# Patient Record
Sex: Female | Born: 1960 | Race: Black or African American | Hispanic: No | Smoking: Current every day smoker
Health system: Southern US, Community
[De-identification: ages and names within clinical notes are randomized; demographics above are authoritative.]

## PROBLEM LIST (undated history)

## (undated) DIAGNOSIS — M1990 Unspecified osteoarthritis, unspecified site: Secondary | ICD-10-CM

## (undated) DIAGNOSIS — K5909 Other constipation: Secondary | ICD-10-CM

## (undated) DIAGNOSIS — I10 Essential (primary) hypertension: Secondary | ICD-10-CM

## (undated) DIAGNOSIS — A5901 Trichomonal vulvovaginitis: Secondary | ICD-10-CM

## (undated) DIAGNOSIS — N898 Other specified noninflammatory disorders of vagina: Secondary | ICD-10-CM

## (undated) DIAGNOSIS — W540XXA Bitten by dog, initial encounter: Secondary | ICD-10-CM

## (undated) DIAGNOSIS — R519 Headache, unspecified: Secondary | ICD-10-CM

## (undated) HISTORY — DX: Unspecified osteoarthritis, unspecified site: M19.90

## (undated) HISTORY — DX: Bitten by dog, initial encounter: W54.0XXA

## (undated) HISTORY — DX: Other specified noninflammatory disorders of vagina: N89.8

---

## 2008-07-07 ENCOUNTER — Emergency Department: Payer: Self-pay | Admitting: Emergency Medicine

## 2008-09-01 ENCOUNTER — Emergency Department: Payer: Self-pay | Admitting: Emergency Medicine

## 2009-02-12 ENCOUNTER — Emergency Department: Payer: Self-pay | Admitting: Emergency Medicine

## 2009-02-13 ENCOUNTER — Emergency Department: Payer: Self-pay | Admitting: Emergency Medicine

## 2009-05-14 ENCOUNTER — Emergency Department: Payer: Self-pay | Admitting: Unknown Physician Specialty

## 2009-05-16 ENCOUNTER — Emergency Department: Payer: Self-pay | Admitting: Emergency Medicine

## 2010-07-16 ENCOUNTER — Emergency Department: Payer: Self-pay | Admitting: Emergency Medicine

## 2010-10-24 ENCOUNTER — Emergency Department: Payer: Self-pay | Admitting: Emergency Medicine

## 2010-11-07 ENCOUNTER — Emergency Department: Payer: Self-pay | Admitting: Emergency Medicine

## 2010-11-28 ENCOUNTER — Ambulatory Visit: Payer: Self-pay | Admitting: Specialist

## 2011-04-26 ENCOUNTER — Emergency Department: Payer: Self-pay | Admitting: Emergency Medicine

## 2011-10-15 ENCOUNTER — Emergency Department: Payer: Self-pay | Admitting: Emergency Medicine

## 2011-10-28 ENCOUNTER — Ambulatory Visit: Payer: Self-pay | Admitting: Family Medicine

## 2013-07-07 ENCOUNTER — Emergency Department: Payer: Self-pay | Admitting: Emergency Medicine

## 2013-12-24 ENCOUNTER — Observation Stay: Payer: Self-pay | Admitting: Internal Medicine

## 2013-12-24 LAB — CK TOTAL AND CKMB (NOT AT ARMC)
CK, Total: 42 U/L
CK-MB: <0.5 ng/mL — ABNORMAL LOW (ref 0.5–3.6)

## 2013-12-24 LAB — CBC
HCT: 39.4 % (ref 35.0–47.0)
HGB: 13.2 g/dL (ref 12.0–16.0)
MCH: 30.4 pg (ref 26.0–34.0)
MCHC: 33.5 g/dL (ref 32.0–36.0)
MCV: 91 fL (ref 80–100)
Platelet: 279 10*3/uL (ref 150–440)
RBC: 4.34 10*6/uL (ref 3.80–5.20)
RDW: 15.9 % — AB (ref 11.5–14.5)
WBC: 4.7 10*3/uL (ref 3.6–11.0)

## 2013-12-24 LAB — BASIC METABOLIC PANEL
ANION GAP: 4 — AB (ref 7–16)
BUN: 10 mg/dL (ref 7–18)
CALCIUM: 8.5 mg/dL (ref 8.5–10.1)
CO2: 25 mmol/L (ref 21–32)
CREATININE: 1.09 mg/dL (ref 0.60–1.30)
Chloride: 113 mmol/L — ABNORMAL HIGH (ref 98–107)
EGFR (African American): >60
EGFR (Non-African Amer.): 59 — ABNORMAL LOW
GLUCOSE: 81 mg/dL (ref 65–99)
Osmolality: 281 (ref 275–301)
POTASSIUM: 4 mmol/L (ref 3.5–5.1)
Sodium: 142 mmol/L (ref 136–145)

## 2013-12-24 LAB — TROPONIN I
TROPONIN-I: 0.24 ng/mL — AB
Troponin-I: 0.22 ng/mL — ABNORMAL HIGH

## 2013-12-24 LAB — CK-MB: CK-MB: <0.5 ng/mL — ABNORMAL LOW (ref 0.5–3.6)

## 2013-12-25 LAB — CK TOTAL AND CKMB (NOT AT ARMC)
CK, Total: 39 U/L
CK-MB: <0.5 ng/mL — ABNORMAL LOW (ref 0.5–3.6)

## 2013-12-25 LAB — TROPONIN I: Troponin-I: 0.2 ng/mL — ABNORMAL HIGH

## 2014-01-03 ENCOUNTER — Emergency Department: Payer: Self-pay | Admitting: Emergency Medicine

## 2014-01-03 LAB — BASIC METABOLIC PANEL
Anion Gap: 3 — ABNORMAL LOW (ref 7–16)
BUN: 9 mg/dL (ref 7–18)
CHLORIDE: 110 mmol/L — AB (ref 98–107)
CO2: 25 mmol/L (ref 21–32)
Calcium, Total: 8.4 mg/dL — ABNORMAL LOW (ref 8.5–10.1)
Creatinine: 0.98 mg/dL (ref 0.60–1.30)
EGFR (African American): >60
EGFR (Non-African Amer.): >60
GLUCOSE: 90 mg/dL (ref 65–99)
OSMOLALITY: 274 (ref 275–301)
POTASSIUM: 3.5 mmol/L (ref 3.5–5.1)
Sodium: 138 mmol/L (ref 136–145)

## 2014-01-03 LAB — CBC
HCT: 35.4 % (ref 35.0–47.0)
HGB: 12 g/dL (ref 12.0–16.0)
MCH: 30.6 pg (ref 26.0–34.0)
MCHC: 33.9 g/dL (ref 32.0–36.0)
MCV: 90 fL (ref 80–100)
Platelet: 205 10*3/uL (ref 150–440)
RBC: 3.94 10*6/uL (ref 3.80–5.20)
RDW: 15.4 % — AB (ref 11.5–14.5)
WBC: 4.8 10*3/uL (ref 3.6–11.0)

## 2014-01-03 LAB — TROPONIN I: Troponin-I: 0.19 ng/mL — ABNORMAL HIGH

## 2014-01-16 ENCOUNTER — Emergency Department: Payer: Self-pay | Admitting: Emergency Medicine

## 2014-03-24 ENCOUNTER — Emergency Department: Payer: Self-pay | Admitting: Emergency Medicine

## 2014-04-27 ENCOUNTER — Observation Stay: Payer: Self-pay | Admitting: Internal Medicine

## 2014-04-27 LAB — CBC
HCT: 38 % (ref 35.0–47.0)
HGB: 12.4 g/dL (ref 12.0–16.0)
MCH: 29.6 pg (ref 26.0–34.0)
MCHC: 32.5 g/dL (ref 32.0–36.0)
MCV: 91 fL (ref 80–100)
Platelet: 248 10*3/uL (ref 150–440)
RBC: 4.17 10*6/uL (ref 3.80–5.20)
RDW: 15 % — ABNORMAL HIGH (ref 11.5–14.5)
WBC: 6.1 10*3/uL (ref 3.6–11.0)

## 2014-04-27 LAB — BASIC METABOLIC PANEL
Anion Gap: 8 (ref 7–16)
BUN: 13 mg/dL (ref 7–18)
Calcium, Total: 8.7 mg/dL (ref 8.5–10.1)
Chloride: 108 mmol/L — ABNORMAL HIGH (ref 98–107)
Co2: 24 mmol/L (ref 21–32)
Creatinine: 1.16 mg/dL (ref 0.60–1.30)
EGFR (Non-African Amer.): 55 — ABNORMAL LOW
GLUCOSE: 110 mg/dL — AB (ref 65–99)
OSMOLALITY: 280 (ref 275–301)
Potassium: 3.4 mmol/L — ABNORMAL LOW (ref 3.5–5.1)
SODIUM: 140 mmol/L (ref 136–145)

## 2014-04-27 LAB — CK-MB
CK-MB: <0.5 ng/mL — ABNORMAL LOW (ref 0.5–3.6)
CK-MB: 0.7 ng/mL (ref 0.5–3.6)

## 2014-04-27 LAB — TROPONIN I
Troponin-I: 0.72 ng/mL — ABNORMAL HIGH
Troponin-I: 0.7 ng/mL — ABNORMAL HIGH

## 2014-04-28 LAB — CBC WITH DIFFERENTIAL/PLATELET
BASOS PCT: 0.9 %
Basophil #: 0 10*3/uL (ref 0.0–0.1)
EOS ABS: 0.1 10*3/uL (ref 0.0–0.7)
EOS PCT: 1 %
HCT: 35.1 % (ref 35.0–47.0)
HGB: 11.8 g/dL — ABNORMAL LOW (ref 12.0–16.0)
Lymphocyte #: 2.3 10*3/uL (ref 1.0–3.6)
Lymphocyte %: 45.4 %
MCH: 30.2 pg (ref 26.0–34.0)
MCHC: 33.5 g/dL (ref 32.0–36.0)
MCV: 90 fL (ref 80–100)
MONO ABS: 0.3 x10 3/mm (ref 0.2–0.9)
MONOS PCT: 5.1 %
NEUTROS PCT: 47.6 %
Neutrophil #: 2.4 10*3/uL (ref 1.4–6.5)
Platelet: 220 10*3/uL (ref 150–440)
RBC: 3.89 10*6/uL (ref 3.80–5.20)
RDW: 15 % — AB (ref 11.5–14.5)
WBC: 5 10*3/uL (ref 3.6–11.0)

## 2014-04-28 LAB — DRUG SCREEN, URINE
Amphetamines, Ur Screen: NEGATIVE (ref ?–1000)
BENZODIAZEPINE, UR SCRN: NEGATIVE (ref ?–200)
Barbiturates, Ur Screen: NEGATIVE (ref ?–200)
Cannabinoid 50 Ng, Ur ~~LOC~~: NEGATIVE (ref ?–50)
Cocaine Metabolite,Ur ~~LOC~~: POSITIVE (ref ?–300)
MDMA (ECSTASY) UR SCREEN: NEGATIVE (ref ?–500)
Methadone, Ur Screen: NEGATIVE (ref ?–300)
OPIATE, UR SCREEN: NEGATIVE (ref ?–300)
Phencyclidine (PCP) Ur S: NEGATIVE (ref ?–25)
TRICYCLIC, UR SCREEN: NEGATIVE (ref ?–1000)

## 2014-04-28 LAB — BASIC METABOLIC PANEL
Anion Gap: 7 (ref 7–16)
BUN: 14 mg/dL (ref 7–18)
CREATININE: 1.07 mg/dL (ref 0.60–1.30)
Calcium, Total: 8.3 mg/dL — ABNORMAL LOW (ref 8.5–10.1)
Chloride: 108 mmol/L — ABNORMAL HIGH (ref 98–107)
Co2: 26 mmol/L (ref 21–32)
EGFR (African American): >60
EGFR (Non-African Amer.): >60
GLUCOSE: 110 mg/dL — AB (ref 65–99)
OSMOLALITY: 282 (ref 275–301)
Potassium: 3.5 mmol/L (ref 3.5–5.1)
SODIUM: 141 mmol/L (ref 136–145)

## 2014-04-28 LAB — CK-MB: CK-MB: 0.8 ng/mL (ref 0.5–3.6)

## 2014-04-28 LAB — TROPONIN I: Troponin-I: 0.66 ng/mL — ABNORMAL HIGH

## 2014-10-23 ENCOUNTER — Ambulatory Visit: Payer: Self-pay | Admitting: Internal Medicine

## 2014-12-19 DIAGNOSIS — M542 Cervicalgia: Secondary | ICD-10-CM | POA: Insufficient documentation

## 2014-12-19 DIAGNOSIS — R200 Anesthesia of skin: Secondary | ICD-10-CM | POA: Insufficient documentation

## 2015-02-07 DIAGNOSIS — R200 Anesthesia of skin: Secondary | ICD-10-CM | POA: Insufficient documentation

## 2015-02-07 DIAGNOSIS — R202 Paresthesia of skin: Secondary | ICD-10-CM

## 2015-02-07 DIAGNOSIS — R29898 Other symptoms and signs involving the musculoskeletal system: Secondary | ICD-10-CM | POA: Insufficient documentation

## 2015-02-09 NOTE — Discharge Summary (Signed)
PATIENT NAME:  Amy Waller, DELOREY MR#:  [id number] DATE OF BIRTH:  1961-04-14  DATE OF ADMISSION:  04/27/2014 DATE OF DISCHARGE:  04/28/2014  PRIMARY CARE PHYSICIAN: Dr. Brynda Greathouse.  CARDIOLOGIST: Dr. Clayborn Bigness.   FINAL DIAGNOSES:  1. Cocaine-induced coronary vasospasm with elevated troponin.  2. Hypertension.  3. Anxiety and depression.   MEDICATIONS ON DISCHARGE: Include diazepam 2 mg 4 times a day as needed as she takes at home, metoprolol tartrate 25 mg 1 tablet twice a day as she takes at home, aspirin 81 mg daily. Stop taking meloxicam. The patient states that she is not taking lisinopril.   DIET: Low-sodium diet, regular consistency.  FOLLOWUP: Follow up with Dr. Clayborn Bigness, cardiology, next week, in 1-2 weeks with Dr. Brynda Greathouse.   HOSPITAL COURSE: The patient was admitted 04/27/2014, discharged 04/28/2014. Came in with chest pain. She was admitted with chest pain and elevated troponin symptoms concerning for acute coronary syndrome. The patient was started on aspirin, metoprolol, nitroglycerin, and a cardiology evaluation was ordered.   LABORATORY AND RADIOLOGICAL DATA DURING THE HOSPITAL COURSE: Included a glucose of 110, BUN 13, creatinine 1.16, sodium 140, potassium 3.4, chloride 108, CO2 of 24, calcium 8.7. White blood cell count 6.1, H and H 12.4 and 38.0, platelet count of 248,000. First troponin borderline at 0.72.   Chest x-ray: No acute cardiopulmonary disease.   The next troponin borderline at 0.7, next troponin borderline at 0.66. Urine toxicology positive for cocaine. Repeat potassium 3.5.   HOSPITAL COURSE PER PROBLEM LIST:  1. Cocaine-induced coronary vasospasm with elevated troponin. The patient was seen in consultation by Dr. Clayborn Bigness. Currently no note in the computer, but I did speak with him. He said okay to discharge home and follow up next week in the office for consideration of stress test. I did continue the patient's aspirin that was started here in the hospital. I  decreased the dose of the metoprolol that she takes at home to 25 mg twice a day. I told her if she is going to use cocaine, that she is not to use the metoprolol. The patient states that she does not use cocaine. She thinks the blunt that she smoked may have been laced. The patient is currently chest pain free at this time and stable for discharge home.  2. Hypertension. Blood pressure stable during the hospital course. Blood pressure upon discharge 106/68.  3. Anxiety and depression on Valium that she takes at home. 4. Tobacco abuse. The patient was counseled on smoking by Dr. Vianne Bulls, the admitting physician.   TIME SPENT ON DISCHARGE: 35 minutes.    ____________________________ Tana Conch. Leslye Peer, MD rjw:lt D: 04/28/2014 14:15:48 ET T: 04/28/2014 21:47:24 ET JOB#: [id number]  cc: Tana Conch. Leslye Peer, MD, <Dictator> Mikeal Hawthorne. Brynda Greathouse, MD Dwayne D. Clayborn Bigness, MD   Marisue Brooklyn MD ELECTRONICALLY SIGNED 05/02/2014 15:18

## 2015-02-09 NOTE — Discharge Summary (Signed)
PATIENT NAME:  Amy Waller, CHAVERO MR#:  [id number] DATE OF BIRTH:  [birth year]  DATE OF ADMISSION:  12/24/2013 DATE OF DISCHARGE:  12/25/2013  ADMISSION DIAGNOSIS: Hypertensive urgency.   DISCHARGE DIAGNOSES: 1.  Hypertensive urgency.  2.  Elevated troponin.  3.  Tobacco use disorder.   CONSULTATIONS: None.   PERTINENT LABORATORIES AT DISCHARGE: Troponin max 0.24, troponin at discharge 0.20.  HOSPITAL COURSE: This is a 54 year old female with hypertension, who had previously been on the medications, who was admitted for hypertensive urgency. For further details please refer to the H and P.  1.  Hypertensive urgency. The patient was started on 2 medications, metoprolol as well as hydralazine. Her blood pressure is better; however, due to the patient being noncompliant for so long, I fear that the patient will be noncompliant to medications so I discharged her with metoprolol 50 mg p.o. b.i.d. and she will follow up with Dr. Brynda Greathouse as an outpatient. For  adequate blood pressure control, I also advised her to check her blood pressure daily with a blood pressure kit that she can obtain from a pharmacy.  2.  Elevated troponin secondary to demand ischemia from hypertensive urgency.  3.  Tobacco dependence. The patient was counseled by the admitting physician regarding stopping smoking.   DISCHARGE MEDICATIONS: 1.  Cyclobenzaprine 10 mg t.i.d. p.r.n.  2.  Meloxicam 15 mg b.i.d. p.r.n.  3.  Aspirin 325 mg daily as needed for headache.   4.  Metoprolol 25 mg 2 tablets b.i.d.  5.  Nicotine patch 14 mg per 24 hours.   DISCHARGE DIET: Low sodium.   DISCHARGE ACTIVITY: As tolerated.   DISCHARGE FOLLOWUP:  The patient will follow up next week with Dr. Brynda Greathouse.   TIME SPENT:  35 minutes.  The patient is medically stable for discharge.   ____________________________ Juanito Gonyer P. Benjie Karvonen, MD spm:cs D: 12/25/2013 13:58:37 ET T: 12/25/2013 14:29:32 ET JOB#: [id number]  cc: Sheyanne Munley P. Benjie Karvonen, MD,  <Dictator> Mikeal Hawthorne. Brynda Greathouse, MD Donell Beers Keilana Morlock MD ELECTRONICALLY SIGNED 12/26/2013 8:38

## 2015-02-09 NOTE — Consult Note (Signed)
PATIENT NAME:  Amy Waller, Amy Waller MR#:  [id number] DATE OF BIRTH:  1961/10/13  DATE OF CONSULTATION:  04/28/2014  REFERRING PHYSICIAN:  Epifanio Lesches, MD CONSULTING PHYSICIAN:  Karletta Millay D. Cameron Katayama, MD  INDICATION FOR CONSULTATION: Chest pain.  HISTORY OF PRESENT ILLNESS: The patient is a 54 year old black female with history of hypertension who has been admitted back in March with chest pain and uncontrolled hypertension and came back today because chest pain recurred. The patient states she has been having chest pain on and off, midsternal, since the day before, which comes and goes, and each time it lasts about an hour. The patient also states chest pain radiating to her left arm was associated with arm numbness. The patient feels short of breath and has dizziness with the chest pain. Troponins were slightly up, 2.72, during the hospitalization. The patient was admitted last time in March and did not have any stress test or echocardiogram, and the patient's chest pain was felt to be secondary to malignant hypertension. She was to be started on blood pressure medication. The patient says that since then she has been compliant with her medication and takes them regularly, but still has recurrent chest pain.   PAST MEDICAL HISTORY: Hypertension.   ALLERGIES: No known drug allergies.   SOCIAL HISTORY: Smokes 3 cigars a day. No alcohol consumption. She denies drugs. Lives with her daughter.   PAST SURGICAL HISTORY: None.   MEDICATIONS: Mobic 15 mg twice a day, aspirin 325 a day, metoprolol 25 mg 2 tablets twice a day, NicoDerm patch, isosorbide.   FAMILY HISTORY: Blood pressure problems.   REVIEW OF SYSTEMS: No blackout spells or syncope.  No nausea or vomiting. No fever and no chills. No sweats. No weight loss or weight gain, hemoptysis or hematemesis. No bright red blood per rectum. No vision change or hearing change. Denies sputum production or cough.   PHYSICAL EXAMINATION: VITAL  SIGNS: Blood pressure 110/60, pulse 75, respiratory rate 16, afebrile.  HEENT: Normocephalic, atraumatic. Pupils equal and reactive to light.  NECK: Supple. No significant JVD, bruits or adenopathy.  LUNGS: Clear to auscultation and percussion. No significant wheeze, rhonchi, or rale.  HEART: Regular rate and rhythm. Systolic ejection murmur at the apex. Positive S4. PMI nondisplaced.  ABDOMEN: Benign.  EXTREMITIES: Within normal limits.  NEUROLOGIC: Intact.  SKIN: Normal   LABORATORIES: Troponin was 0.7. White count 6.1, platelet count 12.4, hemoglobin 38, platelet count 248,000. Electrolytes: Sodium 140, potassium 3.4, chloride 108, bicarb 25, BUN 13, creatinine 1.16, glucose 110. CK/MB less than 0.5.   CHEST X-RAY: No acute changes.   EKG: Normal sinus rhythm, rate of 75. Nonspecific ST-T wave changes.  Tox screen positive for cocaine.   ASSESSMENT: 1.  Chest pain. 2.  Mildly elevated troponins.  3.  Hypertension. 4.  Tobacco abuse. 5.  Degenerative joint disease. 6.  Positive tox screen.   PLAN: 1.  Recommend continued telemetry. Recommend continued blood pressure control. Aspirin. Consider Plavix. Consider echocardiogram. Follow-up EKGs and further troponins. If chest pain continues would recommend either a functional study or cardiac catheterization. 2.  For hypertension, continue hypertension control. Appears to be adequately controlled today. The patient states compliance with her medication. Would consider adding ACE inhibitor in addition to the beta blocker.  3.  For degenerative joint disease, continue Mobic as necessary.  4.  Advised the patient to quit smoking.  5.  Tox screen appears to have cocaine, although it is unclear the etiology or significance, but this could represent  some cardiac symptoms like spasm. Recommend the patient refrain from illicit drug use. 6.  Will have the patient followup. If chest pain continues, would consider cardiac catheterization at that  point. Will have the patient followup for further cardiac evaluation depending on her symptoms.   ____________________________ Loran Senters Clayborn Bigness, MD ddc:sb D: 05/03/2014 13:02:01 ET T: 05/03/2014 13:18:10 ET JOB#: [id number]  cc: Breezie Micucci D. Clayborn Bigness, MD, <Dictator> Yolonda Kida MD ELECTRONICALLY SIGNED 06/01/2014 12:31
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
## 2015-07-09 ENCOUNTER — Emergency Department
Admission: EM | Admit: 2015-07-09 | Discharge: 2015-07-09 | Disposition: A | Discharge status: Home / Self Care | Payer: Medicaid Other | Attending: Emergency Medicine | Admitting: Emergency Medicine

## 2015-07-09 ENCOUNTER — Encounter: Payer: Self-pay | Admitting: *Deleted

## 2015-07-09 DIAGNOSIS — N760 Acute vaginitis: Secondary | ICD-10-CM | POA: Insufficient documentation

## 2015-07-09 DIAGNOSIS — Z720 Tobacco use: Secondary | ICD-10-CM | POA: Diagnosis not present

## 2015-07-09 DIAGNOSIS — R309 Painful micturition, unspecified: Secondary | ICD-10-CM | POA: Diagnosis present

## 2015-07-09 LAB — URINALYSIS COMPLETE WITH MICROSCOPIC (ARMC ONLY)
BACTERIA UA: NONE SEEN
Bilirubin Urine: NEGATIVE
Glucose, UA: NEGATIVE mg/dL
Ketones, ur: NEGATIVE mg/dL
LEUKOCYTES UA: NEGATIVE
Nitrite: NEGATIVE
PH: 7 (ref 5.0–8.0)
Protein, ur: NEGATIVE mg/dL
Specific Gravity, Urine: 1.008 (ref 1.005–1.030)

## 2015-07-09 LAB — CHLAMYDIA/NGC RT PCR (ARMC ONLY)
Chlamydia Tr: NOT DETECTED
N GONORRHOEAE: NOT DETECTED

## 2015-07-09 LAB — WET PREP, GENITAL
Clue Cells Wet Prep HPF POC: NONE SEEN
Trich, Wet Prep: NONE SEEN
WBC WET PREP: NONE SEEN
Yeast Wet Prep HPF POC: NONE SEEN

## 2015-07-09 MED ORDER — FLUCONAZOLE 150 MG PO TABS: 150.0000 mg | ORAL_TABLET | Freq: Once | ORAL | Status: DC

## 2015-07-09 NOTE — ED Notes (Signed)
poct pregnancy Negative 

## 2015-07-09 NOTE — ED Notes (Signed)
Pt reports tampon lost in vagina for 1 week.  Pt states she has a foul odor and burning with urination.

## 2015-07-09 NOTE — Discharge Instructions (Signed)

## 2015-07-09 NOTE — ED Provider Notes (Signed)
[facility] Emergency Department Provider Note  ____________________________________________  Time seen: On arrival  I have reviewed the triage vital signs and the nursing notes.   HISTORY  Chief Complaint Foreign Body in Vagina    HPI Amy Waller is a 54 y.o. female who presents with complaints of burning with urination. She reports generalized vaginal discomfort. She is somewhat concerned that she may forgotten tampon but she is not sure. She also reports she went to be checked for STDs. She denies fevers chills. No rash. No nausea vomiting.    No past medical history on file.  There are no active problems to display for this patient.   No past surgical history on file.  No current outpatient prescriptions on file.  Allergies Review of patient's allergies indicates no known allergies.  No family history on file.  Social History Social History  Substance Use Topics  . Smoking status: Current Every Day Smoker  . Smokeless tobacco: Not on file  . Alcohol Use: No    Review of Systems  Constitutional: Negative for fever. Eyes: Negative for discharge ENT: Negative for sore throat Genitourinary: Positive for dysuria Musculoskeletal: Negative for back pain. Skin: Negative for rash. Neurological: Negative for headaches    ____________________________________________   PHYSICAL EXAM:  VITAL SIGNS: ED Triage Vitals  Enc Vitals Group     BP 07/09/15 1642 129/83 mmHg     Pulse Rate 07/09/15 1642 74     Resp 07/09/15 1642 18     Temp 07/09/15 1642 98.6 F (37 C)     Temp Source 07/09/15 1642 Oral     SpO2 07/09/15 1642 99 %     Weight 07/09/15 1642 117 lb (53.071 kg)     Height 07/09/15 1642 5\' 1"  (1.549 m)     Head Cir --      Peak Flow --      Pain Score 07/09/15 1644 10     Pain Loc --      Pain Edu? --      [place] --      Constitutional: Alert and oriented. Well appearing and in no distress. Eyes:  Conjunctivae are normal.  ENT   Head: Normocephalic and atraumatic.   Mouth/Throat: Mucous membranes are moist. Cardiovascular: Normal rate, regular rhythm.  Respiratory: Normal respiratory effort without tachypnea nor retractions.  Gastrointestinal: Soft and non-tender in all quadrants. No distention. There is no CVA tenderness. GU: No foreign body in vagina. Thick whitish discharge suspicious for yeast infection. No CMT Musculoskeletal: Nontender with normal range of motion in all extremities. Neurologic:  Normal speech and language. No gross focal neurologic deficits are appreciated. Skin:  Skin is warm, dry and intact. No rash noted. Psychiatric: Mood and affect are normal. Patient exhibits appropriate insight and judgment.  ____________________________________________    LABS (pertinent positives/negatives)  Labs Reviewed  URINALYSIS COMPLETEWITH MICROSCOPIC ([facility] ONLY) - Abnormal; Notable for the following:    Color, Urine YELLOW (*)    APPearance HAZY (*)    Hgb urine dipstick 1+ (*)    Squamous Epithelial / LPF 6-30 (*)    All other components within normal limits  CHLAMYDIA/NGC RT PCR ([facility] ONLY)  WET PREP, GENITAL    ____________________________________________     ____________________________________________    RADIOLOGY I have personally reviewed any xrays that were ordered on this patient: None  ____________________________________________   PROCEDURES  Procedure(s) performed: none   ____________________________________________   INITIAL IMPRESSION / ASSESSMENT AND PLAN /  ED COURSE  Pertinent labs & imaging results that were available during my care of the patient were reviewed by me and considered in my medical decision making (see chart for details).  Patient well. No distress. No foreign body on GU exam. Wet prep and GC chlamydia sent. Urinalysis unremarkable. Suspect yeast vaginitis. We will prescribe fluconazole and have the  patient follow-up as needed  ____________________________________________   FINAL CLINICAL IMPRESSION(S) / ED DIAGNOSES  Final diagnoses:  Vaginitis     Lavonia Drafts, MD 07/09/15 [phone]

## 2015-07-23 ENCOUNTER — Other Ambulatory Visit: Payer: Self-pay | Admitting: Neurology

## 2015-07-23 DIAGNOSIS — M79601 Pain in right arm: Secondary | ICD-10-CM

## 2015-07-23 DIAGNOSIS — M5412 Radiculopathy, cervical region: Secondary | ICD-10-CM

## 2015-07-23 DIAGNOSIS — M542 Cervicalgia: Secondary | ICD-10-CM

## 2015-08-03 ENCOUNTER — Ambulatory Visit: Admission: RE | Admit: 2015-08-03 | Payer: Medicaid Other | Source: Ambulatory Visit

## 2015-08-10 ENCOUNTER — Ambulatory Visit
Admission: RE | Admit: 2015-08-10 | Discharge: 2015-08-10 | Disposition: A | Discharge status: Home / Self Care | Payer: Medicaid Other | Source: Ambulatory Visit | Attending: Neurology | Admitting: Neurology

## 2015-08-10 DIAGNOSIS — M50323 Other cervical disc degeneration at C6-C7 level: Secondary | ICD-10-CM | POA: Diagnosis not present

## 2015-08-10 DIAGNOSIS — M79601 Pain in right arm: Secondary | ICD-10-CM

## 2015-08-10 DIAGNOSIS — M47892 Other spondylosis, cervical region: Secondary | ICD-10-CM | POA: Diagnosis not present

## 2015-08-10 DIAGNOSIS — M5412 Radiculopathy, cervical region: Secondary | ICD-10-CM | POA: Diagnosis present

## 2015-08-10 DIAGNOSIS — M4802 Spinal stenosis, cervical region: Secondary | ICD-10-CM | POA: Diagnosis not present

## 2015-08-10 DIAGNOSIS — M542 Cervicalgia: Secondary | ICD-10-CM

## 2015-08-10 DIAGNOSIS — M4803 Spinal stenosis, cervicothoracic region: Secondary | ICD-10-CM | POA: Insufficient documentation

## 2015-08-10 DIAGNOSIS — G9589 Other specified diseases of spinal cord: Secondary | ICD-10-CM | POA: Insufficient documentation

## 2015-08-14 ENCOUNTER — Ambulatory Visit: Payer: Medicaid Other

## 2015-11-18 ENCOUNTER — Emergency Department
Admission: EM | Admit: 2015-11-18 | Discharge: 2015-11-19 | Disposition: A | Discharge status: Home / Self Care | Payer: Medicaid Other | Attending: Emergency Medicine | Admitting: Emergency Medicine

## 2015-11-18 DIAGNOSIS — Y998 Other external cause status: Secondary | ICD-10-CM | POA: Insufficient documentation

## 2015-11-18 DIAGNOSIS — I10 Essential (primary) hypertension: Secondary | ICD-10-CM | POA: Diagnosis not present

## 2015-11-18 DIAGNOSIS — Z23 Encounter for immunization: Secondary | ICD-10-CM | POA: Insufficient documentation

## 2015-11-18 DIAGNOSIS — F17200 Nicotine dependence, unspecified, uncomplicated: Secondary | ICD-10-CM | POA: Insufficient documentation

## 2015-11-18 DIAGNOSIS — S71151A Open bite, right thigh, initial encounter: Secondary | ICD-10-CM | POA: Diagnosis present

## 2015-11-18 DIAGNOSIS — S71101A Unspecified open wound, right thigh, initial encounter: Secondary | ICD-10-CM | POA: Diagnosis not present

## 2015-11-18 DIAGNOSIS — Y9389 Activity, other specified: Secondary | ICD-10-CM | POA: Insufficient documentation

## 2015-11-18 DIAGNOSIS — Y9289 Other specified places as the place of occurrence of the external cause: Secondary | ICD-10-CM | POA: Insufficient documentation

## 2015-11-18 DIAGNOSIS — S71111A Laceration without foreign body, right thigh, initial encounter: Secondary | ICD-10-CM | POA: Insufficient documentation

## 2015-11-18 DIAGNOSIS — W540XXA Bitten by dog, initial encounter: Secondary | ICD-10-CM | POA: Insufficient documentation

## 2015-11-18 HISTORY — DX: Essential (primary) hypertension: I10

## 2015-11-18 MED ORDER — LIDOCAINE HCL (PF) 1 % IJ SOLN
5.0000 mL | Freq: Once | INTRAMUSCULAR | Status: AC
  Administered 2015-11-19: 5 mL
  Filled 2015-11-18: qty 5

## 2015-11-18 MED ORDER — AMOXICILLIN-POT CLAVULANATE 875-125 MG PO TABS: 1.0000 | ORAL_TABLET | Freq: Two times a day (BID) | ORAL | Status: DC

## 2015-11-18 MED ORDER — TETANUS-DIPHTH-ACELL PERTUSSIS 5-2.5-18.5 LF-MCG/0.5 IM SUSP
0.5000 mL | Freq: Once | INTRAMUSCULAR | Status: AC
  Administered 2015-11-18: 0.5 mL via INTRAMUSCULAR
  Filled 2015-11-18: qty 0.5

## 2015-11-18 MED ORDER — TRAMADOL HCL 50 MG PO TABS: 50.0000 mg | ORAL_TABLET | Freq: Four times a day (QID) | ORAL | Status: AC | PRN

## 2015-11-18 NOTE — ED Provider Notes (Signed)
[facility] Emergency Department Provider Note  ____________________________________________  Time seen: Approximately 10:52 PM  I have reviewed the triage vital signs and the nursing notes.   HISTORY  Chief Complaint Animal Bite    HPI Amy Waller is a 55 y.o. female, NAD, presents to the emergency department after being bitten on her right thigh by her neighbor's dog earlier this evening. States she applied a bandage which has helped. No significant pain to the area. The animal's vaccination status is unknown at this time. Animal control is currently talking with the patient again further details. Patient notes she has had a tetanus vaccine in the past but is uncertain of the date.   Past Medical History  Diagnosis Date  . Hypertension     There are no active problems to display for this patient.   History reviewed. No pertinent past surgical history.  Current Outpatient Rx  Name  Route  Sig  Dispense  Refill  . amoxicillin-clavulanate (AUGMENTIN) 875-125 MG tablet   Oral   Take 1 tablet by mouth 2 (two) times daily.   14 tablet   0   . fluconazole (DIFLUCAN) 150 MG tablet   Oral   Take 1 tablet (150 mg total) by mouth once. And then one tablet 72 hours later   2 tablet   0   . traMADol (ULTRAM) 50 MG tablet   Oral   Take 1 tablet (50 mg total) by mouth every 6 (six) hours as needed.   10 tablet   0     Allergies Review of patient's allergies indicates no known allergies.  History reviewed. No pertinent family history.  Social History Social History  Substance Use Topics  . Smoking status: Current Every Day Smoker  . Smokeless tobacco: None  . Alcohol Use: No     Review of Systems  Constitutional: No fever/chills Eyes: No visual changes. No discharge Cardiovascular: No chest pain. Respiratory: No shortness of breath.  Gastrointestinal: No abdominal pain.  No nausea, vomiting.   Musculoskeletal: No myalgias or joint  pain Skin: Positive for 2 lacerations of the right thigh. Negative for rash. Neurological: Negative for headaches, focal weakness or numbness. 10-point ROS otherwise negative.  ____________________________________________   PHYSICAL EXAM:  VITAL SIGNS: ED Triage Vitals  Enc Vitals Group     BP 11/18/15 2214 183/119 mmHg     Pulse --      Resp 11/18/15 2214 18     Temp 11/18/15 2214 97.4 F (36.3 C)     Temp Source 11/18/15 2214 Oral     SpO2 11/18/15 2214 100 %     Weight 11/18/15 2214 117 lb (53.071 kg)     Height 11/18/15 2214 5\' 1"  (1.549 m)     Head Cir --      Peak Flow --      Pain Score 11/18/15 2216 8     Pain Loc --      Pain Edu? --      Excl. in [place]? --     Constitutional: Alert and oriented. Well appearing and in no acute distress. Eyes: Conjunctivae are normal. PERRL. Head: Atraumatic. Hematological/Lymphatic/Immunilogical: No cervical lymphadenopathy. Cardiovascular: Normal rate, regular rhythm. Normal S1 and S2.  Good peripheral circulation. Respiratory: Normal respiratory effort without tachypnea or retractions. Lungs CTAB. Gastrointestinal: Soft and nontender. No distention.  Musculoskeletal: No lower extremity tenderness nor edema.  No joint effusions. Neurologic:  Normal speech and language. No gross focal neurologic deficits are appreciated.  Skin:  9cm.4cm open wound to the right thigh with exposed subcutaneous tissue; second open wound noted to be 2.5cm annular Psychiatric: Mood and affect are normal. Speech and behavior are normal. Patient exhibits appropriate insight and judgement.   ____________________________________________   LABS  None ____________________________________________  EKG  None ____________________________________________  RADIOLOGY  None ____________________________________________    PROCEDURES  Procedure(s) performed: LACERATION REPAIR Performed by: Braxton Feathers Authorized by: Braxton Feathers Consent: Verbal  consent obtained. Risks and benefits: risks, benefits and alternatives were discussed Consent given by: patient Patient identity confirmed: provided demographic data Prepped and Draped in normal sterile fashion Wound explored  Laceration Location: right anterior thigh  Laceration Length: 9cm  No Foreign Bodies seen or palpated  Anesthesia: local infiltration  Local anesthetic: lidocaine 1% without epinephrine  Anesthetic total: 5 ml  Irrigation method: syringe with normal saline Amount of cleaning: standard  Skin closure: 3-0 ethilon  Number of sutures: 3  Technique: simple interrupted; wound loosely approximated to allow drainage  Patient tolerance: Patient tolerated the procedure well with no immediate complications.    Medications  Tdap (BOOSTRIX) injection 0.5 mL (0.5 mLs Intramuscular Given 11/18/15 2305)     ____________________________________________   INITIAL IMPRESSION / ASSESSMENT AND PLAN / ED COURSE  Pertinent labs & imaging results that were available during my care of the patient were reviewed by me and considered in my medical decision making (see chart for details).  Patient's diagnosis is consistent with open wounds secondary to dog bite. Patient will be discharged home with prescriptions for Augmentin to take twice daily for 7 days to cover empirically for potential infection. Advise follow-up in 48 hours for wound recheck.  At this time vaccination status of the animal in question is unknown. Animal control has been alerted and will be in touch with the patient once more information is known. Patient is given ED precautions to return to the ED for any worsening or new symptoms.    ____________________________________________  FINAL CLINICAL IMPRESSION(S) / ED DIAGNOSES  Final diagnoses:  Open wound of right thigh, initial encounter  Dog bite  Tetanus toxoid vaccination administered at current visit      NEW MEDICATIONS STARTED DURING THIS  VISIT:  New Prescriptions   AMOXICILLIN-CLAVULANATE (AUGMENTIN) 875-125 MG TABLET    Take 1 tablet by mouth 2 (two) times daily.   TRAMADOL (ULTRAM) 50 MG TABLET    Take 1 tablet (50 mg total) by mouth every 6 (six) hours as needed.         Braxton Feathers, PA-C 11/18/15 2355  Orbie Pyo, MD 11/19/15 0001

## 2015-11-18 NOTE — Discharge Instructions (Signed)
Animal Bite Animal bites can range from mild to serious. An animal bite can result in a scratch on the skin, a deep open cut, a puncture of the skin, a crush injury, or tearing away of the skin or a body part. A small bite from a house pet will usually not cause serious problems. However, some animal bites can become infected or injure a bone or other tissue.  Bites from certain animals can be more dangerous because of the risk of spreading rabies, which is a serious viral infection. This risk is higher with bites from stray animals or wild animals, such as raccoons, foxes, skunks, and bats. Dogs are responsible for most animal bites. Children are bitten more often than adults. SYMPTOMS  Common symptoms of an animal bite include:   Pain.   Bleeding.   Swelling.   Bruising.  DIAGNOSIS  This condition may be diagnosed based on a physical exam and medical history. Your health care provider will examine the wound and ask for details about the animal and how the bite happened. You may also have tests, such as:   Blood tests to check for infection or to determine if surgery is needed.  X-rays to check for damage to bones or joints.  Culture test. This uses a sample of fluid from the wound to check for infection. TREATMENT  Treatment varies depending on the location and type of animal bite and your medical history. Treatment may include:   Wound care. This often includes cleaning the wound, flushing the wound with saline solution, and applying a bandage (dressing). Sometimes, the wound is left open to heal because of the high risk of infection. However, in some cases, the wound may be closed with stitches (sutures), staples, skin glue, or adhesive strips.   Antibiotic medicine.   Tetanus shot.   Rabies treatment if the animal could have rabies.  In some cases, bites that have become infected may require IV antibiotics and surgical treatment in the hospital.  HOME CARE  INSTRUCTIONS Wound Care  Follow instructions from your health care provider about how to take care of your wound. Make sure you:  Wash your hands with soap and water before you change your dressing. If soap and water are not available, use hand sanitizer.  Change your dressing as told by your health care provider.  Leave sutures, skin glue, or adhesive strips in place. These skin closures may need to be in place for 2 weeks or longer. If adhesive strip edges start to loosen and curl up, you may trim the loose edges. Do not remove adhesive strips completely unless your health care provider tells you to do that.  Check your wound every day for signs of infection. Watch for:   Increasing redness, swelling, or pain.   Fluid, blood, or pus.  General Instructions  Take or apply over-the-counter and prescription medicines only as told by your health care provider.   If you were prescribed an antibiotic, take or apply it as told by your health care provider. Do not stop using the antibiotic even if your condition improves.   Keep the injured area raised (elevated) above the level of your heart while you are sitting or lying down, if this is possible.   If directed, apply ice to the injured area.   Put ice in a plastic bag.   Place a towel between your skin and the bag.   Leave the ice on for 20 minutes, 2-3 times per day.     SEEK MEDICAL CARE IF:  You have increasing redness, swelling, or pain at the site of your wound.  You have a general feeling of sickness (malaise).  You feel nauseous or you vomit.  You have pain that does not get better.  SEEK IMMEDIATE MEDICAL CARE IF:  You have a red streak extending away from your wound.  You have fluid, blood, or pus coming from your wound.  You have a fever or chills.  You have trouble moving your injured area.  You have numbness or tingling extending beyond the wound. This information is not intended to replace  advice given to you by your health care provider. Make sure you discuss any questions you have with your health care provider.  Document Released: 06/23/2011 Document Revised: 06/26/2015 Document Reviewed: 02/20/2015  Elsevier Interactive Patient Education 2016 [place], or Adhesive Wound Closure Health care providers use stitches (sutures), staples, and certain glue (skin adhesives) to hold skin together while it heals (wound closure). You may need this treatment after you have surgery or if you cut your skin accidentally. These methods help your skin to heal more quickly and make it less likely that you will have a scar. A wound may take several months to heal completely. The type of wound you have determines when your wound gets closed. In most cases, the wound is closed as soon as possible (primary skin closure). Sometimes, closure is delayed so the wound can be cleaned and allowed to heal naturally. This reduces the chance of infection. Delayed closure may be needed if your wound:  Is caused by a bite.  Happened more than 6 hours ago.  Involves loss of skin or the tissues under the skin.  Has dirt or debris in it that cannot be removed.  Is infected. WHAT ARE THE DIFFERENT KINDS OF WOUND CLOSURES? There are many options for wound closure. The one that your health care provider uses depends on how deep and how large your wound is. Adhesive Glue To use this type of glue to close a wound, your health care provider holds the edges of the wound together and paints the glue on the surface of your skin. You may need more than one layer of glue. Then the wound may be covered with a light bandage (dressing). This type of skin closure may be used for small wounds that are not deep (superficial). Using glue for wound closure is less painful than other methods. It does not require a medicine that numbs the area (local anesthetic). This method also leaves nothing to be removed.  Adhesive glue is often used for children and on facial wounds. Adhesive glue cannot be used for wounds that are deep, uneven, or bleeding. It is not used inside of a wound.  Adhesive Strips These strips are made of sticky (adhesive), porous paper. They are applied across your skin edges like a regular adhesive bandage. You leave them on until they fall off. Adhesive strips may be used to close very superficial wounds. They may also be used along with sutures to improve the closure of your skin edges.  Sutures Sutures are the oldest method of wound closure. Sutures can be made from natural substances, such as silk, or from synthetic materials, such as nylon and steel. They can be made from a material that your body can break down as your wound heals (absorbable), or they can be made from a material that needs to be removed from your skin (nonabsorbable). They  come in many different strengths and sizes. Your health care provider attaches the sutures to a steel needle on one end. Sutures can be passed through your skin, or through the tissues beneath your skin. Then they are tied and cut. Your skin edges may be closed in one continuous stitch or in separate stitches. Sutures are strong and can be used for all kinds of wounds. Absorbable sutures may be used to close tissues under the skin. The disadvantage of sutures is that they may cause skin reactions that lead to infection. Nonabsorbable sutures need to be removed. Staples When surgical staples are used to close a wound, the edges of your skin on both sides of the wound are brought close together. A staple is placed across the wound, and an instrument secures the edges together. Staples are often used to close surgical cuts (incisions). Staples are faster to use than sutures, and they cause less skin reaction. Staples need to be removed using a tool that bends the staples away from your skin. HOW DO I CARE FOR MY WOUND CLOSURE?  Take medicines only as  directed by your health care provider.  If you were prescribed an antibiotic medicine for your wound, finish it all even if you start to feel better.  Use ointments or creams only as directed by your health care provider.  Wash your hands with soap and water before and after touching your wound.  Do not soak your wound in water. Do not take baths, swim, or use a hot tub until your health care provider approves.  Ask your health care provider when you can start showering. Cover your wound if directed by your health care provider.  Do not take out your own sutures or staples.  Do not pick at your wound. Picking can cause an infection.  Keep all follow-up visits as directed by your health care provider. This is important. HOW LONG WILL I HAVE MY WOUND CLOSURE?  Leave adhesive glue on your skin until the glue peels away.  Leave adhesive strips on your skin until the strips fall off.  Absorbable sutures will dissolve within several days.  Nonabsorbable sutures and staples must be removed. The location of the wound will determine how long they stay in. This can range from several days to a couple of weeks. WHEN SHOULD I SEEK HELP FOR MY WOUND CLOSURE? Contact your health care provider if:  You have a fever.  You have chills.  You have drainage, redness, swelling, or pain at your wound.  There is a bad smell coming from your wound.  The skin edges of your wound start to separate after your sutures have been removed.  Your wound becomes thick, raised, and darker in color after your sutures come out (scarring).   This information is not intended to replace advice given to you by your health care provider. Make sure you discuss any questions you have with your health care provider.   Document Released: 06/30/2001 Document Revised: 10/26/2014 Document Reviewed: 03/14/2014 Elsevier Interactive Patient Education [employer].

## 2015-11-18 NOTE — ED Notes (Addendum)
Pt arrived to ED with reported "dog bite". Pt reports dog bite to right thigh at 9:30pm. Pt reports dog that bit her was the neighbors dog. 2 linear open wounds noted to right upper leg, Bleeding controlled with bandage.

## 2015-11-19 MED ORDER — AMOXICILLIN-POT CLAVULANATE 875-125 MG PO TABS
1.0000 | ORAL_TABLET | Freq: Once | ORAL | Status: AC
  Administered 2015-11-19: 1 via ORAL
  Filled 2015-11-19: qty 1

## 2015-11-19 NOTE — ED Notes (Signed)

## 2015-11-24 DIAGNOSIS — W540XXA Bitten by dog, initial encounter: Secondary | ICD-10-CM

## 2015-11-24 HISTORY — DX: Bitten by dog, initial encounter: W54.0XXA

## 2015-11-28 ENCOUNTER — Encounter: Payer: Self-pay | Admitting: General Surgery

## 2015-11-28 ENCOUNTER — Ambulatory Visit (INDEPENDENT_AMBULATORY_CARE_PROVIDER_SITE_OTHER): Payer: Medicaid Other | Admitting: General Surgery

## 2015-11-28 VITALS — BP 122/74 | HR 72 | Resp 12 | Ht 61.0 in | Wt 120.0 lb

## 2015-11-28 DIAGNOSIS — Z1211 Encounter for screening for malignant neoplasm of colon: Secondary | ICD-10-CM | POA: Diagnosis not present

## 2015-11-28 MED ORDER — POLYETHYLENE GLYCOL 3350 17 GM/SCOOP PO POWD: ORAL | Status: DC

## 2015-11-28 NOTE — Progress Notes (Signed)
Patient ID: Amy Waller, female   DOB: [birth year], 55 y.o.   MRN: [id number]  Chief Complaint  Patient presents with  . Colonoscopy    HPI Amy Waller is a 55 y.o. female.  Here today to discuss having a screening colonoscopy. None prior. Denies any gastrointestinal issues at this time. She had a recent dog bite so she is currently on an antibiotic. No known history of colon cancer or polyps in her family. I have reviewed the history of present illness with the patient. HPI  Past Medical History  Diagnosis Date  . Hypertension   . Arthritis   . Dog bite 11-24-15    right thigh    Past Surgical History  Procedure Laterality Date  . Cesarean section  1987. 1998    Family History  Problem Relation Age of Onset  . Cancer Mother 20    ovarian  . Cancer Sister 53    throat  . Cancer Maternal Uncle     throat    Social History Social History  Substance Use Topics  . Smoking status: Current Every Day Smoker -- 1.00 packs/day for 10 years    Types: Cigarettes  . Smokeless tobacco: Never Used  . Alcohol Use: 0.0 oz/week    0 Standard drinks or equivalent per week     Comment: occasionally    No Known Allergies  Current Outpatient Prescriptions  Medication Sig Dispense Refill  . amoxicillin-clavulanate (AUGMENTIN) 875-125 MG tablet Take 1 tablet by mouth 2 (two) times daily. 14 tablet 0  . metoprolol tartrate (LOPRESSOR) 25 MG tablet Take by mouth.    . traMADol (ULTRAM) 50 MG tablet Take 1 tablet (50 mg total) by mouth every 6 (six) hours as needed. 10 tablet 0  . polyethylene glycol powder (GLYCOLAX/MIRALAX) powder 255 grams one bottle for colonoscopy prep 255 g 0   No current facility-administered medications for this visit.    Review of Systems Review of Systems  Constitutional: Negative.   Respiratory: Negative.   Cardiovascular: Negative.   Gastrointestinal: Negative.     Blood pressure 122/74, pulse 72, resp. rate 12, height 5\' 1"  (1.549 m),  weight 120 lb (54.432 kg), last menstrual period 10/18/2015.  Physical Exam Physical Exam  Constitutional: She is oriented to person, place, and time. She appears well-developed and well-nourished.  Eyes: Conjunctivae are normal. No scleral icterus.  Neck: Neck supple.  Cardiovascular: Normal rate, regular rhythm and normal heart sounds.   Pulmonary/Chest: Effort normal and breath sounds normal.  Abdominal: Soft. There is no tenderness.  Lymphadenopathy:    She has no cervical adenopathy.  Neurological: She is alert and oriented to person, place, and time.  Skin: Skin is warm and dry.     Psychiatric: Her behavior is normal.    Data Reviewed Notes reviewed.  Assessment    Exam normal. Pt need s her first screening colonoscopy     Plan    Plan to finish antibiotics and then proceed with colonoscopy.  Colonoscopy with possible biopsy/polypectomy prn: Information regarding the procedure, including its potential risks and complications (including but not limited to perforation of the bowel, which may require emergency surgery to repair, and bleeding) was verbally given to the patient. Educational information regarding lower intestinal endoscopy was given to the patient. Written instructions for how to complete the bowel prep using Miralax were provided. The importance of drinking ample fluids to avoid dehydration as a result of the prep emphasized.      Patient has  been scheduled for a colonoscopy on 12-25-15 at [facility].  PCP:  Nicky Pugh B This information has been scribed by Karie Fetch Ames.   Sinclair Arrazola G 11/28/2015, 3:17 PM

## 2015-11-28 NOTE — Patient Instructions (Addendum)
Colonoscopy A colonoscopy is an exam to look at the entire large intestine (colon). This exam can help find problems such as tumors, polyps, inflammation, and areas of bleeding. The exam takes about 1 hour.  LET [facility] CARE PROVIDER KNOW ABOUT:   Any allergies you have.  All medicines you are taking, including vitamins, herbs, eye drops, creams, and over-the-counter medicines.  Previous problems you or members of your family have had with the use of anesthetics.  Any blood disorders you have.  Previous surgeries you have had.  Medical conditions you have. RISKS AND COMPLICATIONS  Generally, this is a safe procedure. However, as with any procedure, complications can occur. Possible complications include:  Bleeding.  Tearing or rupture of the colon wall.  Reaction to medicines given during the exam.  Infection (rare). BEFORE THE PROCEDURE   Ask your health care provider about changing or stopping your regular medicines.  You may be prescribed an oral bowel prep. This involves drinking a large amount of medicated liquid, starting the day before your procedure. The liquid will cause you to have multiple loose stools until your stool is almost clear or light green. This cleans out your colon in preparation for the procedure.  Do not eat or drink anything else once you have started the bowel prep, unless your health care provider tells you it is safe to do so.  Arrange for someone to drive you home after the procedure. PROCEDURE   You will be given medicine to help you relax (sedative).  You will lie on your side with your knees bent.  A long, flexible tube with a light and camera on the end (colonoscope) will be inserted through the rectum and into the colon. The camera sends video back to a computer screen as it moves through the colon. The colonoscope also releases carbon dioxide gas to inflate the colon. This helps your health care provider see the area better.  During  the exam, your health care provider may take a small tissue sample (biopsy) to be examined under a microscope if any abnormalities are found.  The exam is finished when the entire colon has been viewed. AFTER THE PROCEDURE   Do not drive for 24 hours after the exam.  You may have a small amount of blood in your stool.  You may pass moderate amounts of gas and have mild abdominal cramping or bloating. This is caused by the gas used to inflate your colon during the exam.  Ask when your test results will be ready and how you will get your results. Make sure you get your test results.   This information is not intended to replace advice given to you by your health care provider. Make sure you discuss any questions you have with your health care provider.   Document Released: 10/02/2000 Document Revised: 07/26/2013 Document Reviewed: 06/12/2013 Elsevier Interactive Patient Education [employer].  Patient has been scheduled for a colonoscopy on 12-25-15 at [facility].

## 2015-12-23 ENCOUNTER — Other Ambulatory Visit: Payer: Self-pay | Admitting: General Surgery

## 2015-12-24 ENCOUNTER — Encounter: Payer: Self-pay | Admitting: *Deleted

## 2015-12-25 ENCOUNTER — Encounter: Payer: Self-pay | Admitting: Anesthesiology

## 2015-12-25 ENCOUNTER — Ambulatory Visit
Admission: RE | Admit: 2015-12-25 | Discharge: 2015-12-25 | Disposition: A | Discharge status: Home / Self Care | Payer: Medicaid Other | Source: Ambulatory Visit | Attending: General Surgery | Admitting: General Surgery

## 2015-12-25 ENCOUNTER — Ambulatory Visit: Payer: Medicaid Other | Admitting: Anesthesiology

## 2015-12-25 ENCOUNTER — Encounter
Admission: RE | Disposition: A | Discharge status: Home / Self Care | Payer: Self-pay | Source: Ambulatory Visit | Attending: General Surgery

## 2015-12-25 ENCOUNTER — Telehealth: Payer: Self-pay | Admitting: *Deleted

## 2015-12-25 DIAGNOSIS — M1990 Unspecified osteoarthritis, unspecified site: Secondary | ICD-10-CM | POA: Diagnosis not present

## 2015-12-25 DIAGNOSIS — I10 Essential (primary) hypertension: Secondary | ICD-10-CM | POA: Insufficient documentation

## 2015-12-25 DIAGNOSIS — Z539 Procedure and treatment not carried out, unspecified reason: Secondary | ICD-10-CM | POA: Insufficient documentation

## 2015-12-25 DIAGNOSIS — Z79899 Other long term (current) drug therapy: Secondary | ICD-10-CM | POA: Diagnosis not present

## 2015-12-25 DIAGNOSIS — Z1211 Encounter for screening for malignant neoplasm of colon: Secondary | ICD-10-CM | POA: Insufficient documentation

## 2015-12-25 DIAGNOSIS — F17210 Nicotine dependence, cigarettes, uncomplicated: Secondary | ICD-10-CM | POA: Insufficient documentation

## 2015-12-25 LAB — URINE DRUG SCREEN, QUALITATIVE (ARMC ONLY)
AMPHETAMINES, UR SCREEN: NOT DETECTED
Barbiturates, Ur Screen: NOT DETECTED
Benzodiazepine, Ur Scrn: NOT DETECTED
COCAINE METABOLITE, UR ~~LOC~~: POSITIVE — AB
Cannabinoid 50 Ng, Ur ~~LOC~~: NOT DETECTED
MDMA (Ecstasy)Ur Screen: NOT DETECTED
METHADONE SCREEN, URINE: NOT DETECTED
OPIATE, UR SCREEN: NOT DETECTED
Phencyclidine (PCP) Ur S: NOT DETECTED
Tricyclic, Ur Screen: NOT DETECTED

## 2015-12-25 LAB — POCT PREGNANCY, URINE: Preg Test, Ur: NEGATIVE

## 2015-12-25 SURGERY — COLONOSCOPY WITH PROPOFOL
Anesthesia: General

## 2015-12-25 MED ORDER — SODIUM CHLORIDE 0.9 % IV SOLN: INTRAVENOUS | Status: DC

## 2015-12-25 NOTE — Telephone Encounter (Signed)
Patient was scheduled for a colonoscopy on 12-25-15 but was unable to have completed due to the fact that she tested positive for cocaine.   Message left for patient to call the office.   We need to reschedule colonoscopy at patient's convenience. A new Miralax prescription will need to be sent in to patient's pharmacy once date arranged.   Patient will also need to be reminded that she cannot have any drugs in her system and lab work will be repeated the morning of scheduled procedure to confirm.

## 2015-12-25 NOTE — Interval H&P Note (Signed)
History and Physical Interval Note:  12/25/2015 10:00 AM  Amy Waller  has presented today for surgery, with the diagnosis of SCREEN  The various methods of treatment have been discussed with the patient and family. After consideration of risks, benefits and other options for treatment, the patient has consented to  Procedure(s): COLONOSCOPY WITH PROPOFOL (N/A) as a surgical intervention .  The patient's history has been reviewed, patient examined, no change in status, stable for surgery.  I have reviewed the patient's chart and labs.  Questions were answered to the patient's satisfaction.     SANKAR,SEEPLAPUTHUR G

## 2015-12-25 NOTE — H&P (View-Only) (Signed)
Patient ID: Amy Waller, female   DOB: Dec 04, 1960, 55 y.o.   MRN: [id number]  Chief Complaint  Patient presents with  . Colonoscopy    HPI Amy Waller is a 55 y.o. female.  Here today to discuss having a screening colonoscopy. None prior. Denies any gastrointestinal issues at this time. She had a recent dog bite so she is currently on an antibiotic. No known history of colon cancer or polyps in her family. I have reviewed the history of present illness with the patient. HPI  Past Medical History  Diagnosis Date  . Hypertension   . Arthritis   . Dog bite 11-24-15    right thigh    Past Surgical History  Procedure Laterality Date  . Cesarean section  1987. 1998    Family History  Problem Relation Age of Onset  . Cancer Mother 43    ovarian  . Cancer Sister 53    throat  . Cancer Maternal Uncle     throat    Social History Social History  Substance Use Topics  . Smoking status: Current Every Day Smoker -- 1.00 packs/day for 10 years    Types: Cigarettes  . Smokeless tobacco: Never Used  . Alcohol Use: 0.0 oz/week    0 Standard drinks or equivalent per week     Comment: occasionally    No Known Allergies  Current Outpatient Prescriptions  Medication Sig Dispense Refill  . amoxicillin-clavulanate (AUGMENTIN) 875-125 MG tablet Take 1 tablet by mouth 2 (two) times daily. 14 tablet 0  . metoprolol tartrate (LOPRESSOR) 25 MG tablet Take by mouth.    . traMADol (ULTRAM) 50 MG tablet Take 1 tablet (50 mg total) by mouth every 6 (six) hours as needed. 10 tablet 0  . polyethylene glycol powder (GLYCOLAX/MIRALAX) powder 255 grams one bottle for colonoscopy prep 255 g 0   No current facility-administered medications for this visit.    Review of Systems Review of Systems  Constitutional: Negative.   Respiratory: Negative.   Cardiovascular: Negative.   Gastrointestinal: Negative.     Blood pressure 122/74, pulse 72, resp. rate 12, height 5\' 1"  (1.549 m),  weight 120 lb (54.432 kg), last menstrual period 10/18/2015.  Physical Exam Physical Exam  Constitutional: She is oriented to person, place, and time. She appears well-developed and well-nourished.  Eyes: Conjunctivae are normal. No scleral icterus.  Neck: Neck supple.  Cardiovascular: Normal rate, regular rhythm and normal heart sounds.   Pulmonary/Chest: Effort normal and breath sounds normal.  Abdominal: Soft. There is no tenderness.  Lymphadenopathy:    She has no cervical adenopathy.  Neurological: She is alert and oriented to person, place, and time.  Skin: Skin is warm and dry.     Psychiatric: Her behavior is normal.    Data Reviewed Notes reviewed.  Assessment    Exam normal. Pt need s her first screening colonoscopy     Plan    Plan to finish antibiotics and then proceed with colonoscopy.  Colonoscopy with possible biopsy/polypectomy prn: Information regarding the procedure, including its potential risks and complications (including but not limited to perforation of the bowel, which may require emergency surgery to repair, and bleeding) was verbally given to the patient. Educational information regarding lower intestinal endoscopy was given to the patient. Written instructions for how to complete the bowel prep using Miralax were provided. The importance of drinking ample fluids to avoid dehydration as a result of the prep emphasized.      Patient has  been scheduled for a colonoscopy on 12-25-15 at [facility].  PCP:  Nicky Pugh B This information has been scribed by Karie Fetch [place].   Leanor Voris G 11/28/2015, 3:17 PM

## 2015-12-25 NOTE — Anesthesia Preprocedure Evaluation (Deleted)
Anesthesia Evaluation  Patient identified by MRN, date of birth, ID band Patient awake    Reviewed: Allergy & Precautions, NPO status , Patient's Chart, lab work & pertinent test results, reviewed documented beta blocker date and time   Airway Mallampati: II  TM Distance: >3 FB     Dental  (+) Chipped   Pulmonary Current Smoker,           Cardiovascular hypertension, Pt. on medications and Pt. on home beta blockers      Neuro/Psych    GI/Hepatic   Endo/Other    Renal/GU      Musculoskeletal  (+) Arthritis ,   Abdominal   Peds  Hematology   Anesthesia Other Findings   Reproductive/Obstetrics                             Anesthesia Physical Anesthesia Plan  ASA: II  Anesthesia Plan: General   Post-op Pain Management:    Induction: Intravenous  Airway Management Planned: Nasal Cannula  Additional Equipment:   Intra-op Plan:   Post-operative Plan:   Informed Consent: I have reviewed the patients History and Physical, chart, labs and discussed the procedure including the risks, benefits and alternatives for the proposed anesthesia with the patient or authorized representative who has indicated his/her understanding and acceptance.     Plan Discussed with: CRNA  Anesthesia Plan Comments:         Anesthesia Quick Evaluation

## 2016-01-01 NOTE — Telephone Encounter (Signed)
Another message left for patient to call the office back to get colonoscopy rescheduled.

## 2016-01-07 ENCOUNTER — Telehealth: Payer: Self-pay | Admitting: *Deleted

## 2016-01-07 NOTE — Telephone Encounter (Signed)
Patient wants to reschedule her colonoscopy. She was scheduled on 12/25/15 with Dr.Sankar.

## 2016-01-08 MED ORDER — POLYETHYLENE GLYCOL 3350 17 GM/SCOOP PO POWD: ORAL | Status: DC

## 2016-01-08 NOTE — Telephone Encounter (Signed)
Patient was contacted today and has been rescheduled for a colonoscopy on 02-05-16 at [facility].  Miralax prescription was sent in to patient's pharmacy electronically.   This patient was reminded not to have any drugs in her system when she presents for colonoscopy as labs will be rechecked at that time.

## 2016-02-05 ENCOUNTER — Encounter: Payer: Self-pay | Admitting: Anesthesiology

## 2016-02-05 ENCOUNTER — Encounter: Payer: Self-pay | Admitting: *Deleted

## 2016-02-05 ENCOUNTER — Ambulatory Visit
Admission: RE | Admit: 2016-02-05 | Discharge: 2016-02-05 | Disposition: A | Discharge status: Home / Self Care | Payer: Medicaid Other | Source: Ambulatory Visit | Attending: General Surgery | Admitting: General Surgery

## 2016-02-05 ENCOUNTER — Encounter
Admission: RE | Disposition: A | Discharge status: Home / Self Care | Payer: Self-pay | Source: Ambulatory Visit | Attending: General Surgery

## 2016-02-05 DIAGNOSIS — Z1211 Encounter for screening for malignant neoplasm of colon: Secondary | ICD-10-CM | POA: Diagnosis present

## 2016-02-05 DIAGNOSIS — Z539 Procedure and treatment not carried out, unspecified reason: Secondary | ICD-10-CM | POA: Insufficient documentation

## 2016-02-05 LAB — URINE DRUG SCREEN, QUALITATIVE (ARMC ONLY)
AMPHETAMINES, UR SCREEN: NOT DETECTED
BENZODIAZEPINE, UR SCRN: NOT DETECTED
Barbiturates, Ur Screen: NOT DETECTED
CANNABINOID 50 NG, UR ~~LOC~~: NOT DETECTED
Cocaine Metabolite,Ur ~~LOC~~: POSITIVE — AB
MDMA (ECSTASY) UR SCREEN: NOT DETECTED
Methadone Scn, Ur: NOT DETECTED
OPIATE, UR SCREEN: NOT DETECTED
PHENCYCLIDINE (PCP) UR S: NOT DETECTED
Tricyclic, Ur Screen: NOT DETECTED

## 2016-02-05 SURGERY — COLONOSCOPY WITH PROPOFOL
Anesthesia: General

## 2016-02-05 MED ORDER — SODIUM CHLORIDE 0.9 % IV SOLN: INTRAVENOUS | Status: DC

## 2016-02-05 NOTE — Interval H&P Note (Signed)
History and Physical Interval Note:  02/05/2016 7:47 AM  Amy Waller  has presented today for surgery, with the diagnosis of SCREENING  The various methods of treatment have been discussed with the patient and family. After consideration of risks, benefits and other options for treatment, the patient has consented to  Procedure(s): COLONOSCOPY WITH PROPOFOL (N/A) as a surgical intervention .  The patient's history has been reviewed, patient examined, no change in status, stable for surgery.  I have reviewed the patient's chart and labs.  Questions were answered to the patient's satisfaction.     Pheonix Wisby G

## 2016-02-05 NOTE — H&P (Signed)
Amy Waller is an 55 y.o. female.   Chief Complaint: here for planned colonoscopy HPI: 55 yr old female here for her first screening colonoscopy. No GI  complaints.   Past Medical History  Diagnosis Date  . Hypertension   . Arthritis   . Dog bite 11-24-15    right thigh    Past Surgical History  Procedure Laterality Date  . Cesarean section  1987. 1998    Family History  Problem Relation Age of Onset  . Cancer Mother 53    ovarian  . Cancer Sister 53    throat  . Cancer Maternal Uncle     throat   Social History:  reports that she has been smoking Cigarettes.  She has a 5 pack-year smoking history. She has never used smokeless tobacco. She reports that she uses illicit drugs. She reports that she does not drink alcohol.  Allergies: No Known Allergies  Medications Prior to Admission  Medication Sig Dispense Refill  . metoprolol tartrate (LOPRESSOR) 25 MG tablet Take by mouth.    Marland Kitchen amoxicillin-clavulanate (AUGMENTIN) 875-125 MG tablet Take 1 tablet by mouth 2 (two) times daily. (Patient not taking: Reported on 02/05/2016) 14 tablet 0  . polyethylene glycol powder (GLYCOLAX/MIRALAX) powder 255 grams one bottle for colonoscopy prep 255 g 0  . traMADol (ULTRAM) 50 MG tablet Take 1 tablet (50 mg total) by mouth every 6 (six) hours as needed. 10 tablet 0    No results found for this or any previous visit (from the past 48 hour(s)). No results found.  Review of Systems  Constitutional: Negative.   Respiratory: Negative.   Cardiovascular: Negative.   Gastrointestinal: Negative.   Genitourinary: Negative.     Blood pressure 123/76, pulse 65, temperature 96.5 F (35.8 C), temperature source Tympanic, resp. rate 18, height 5\' 1"  (1.549 m), weight 120 lb (54.432 kg), last menstrual period 10/18/2015, SpO2 100 %. Physical Exam  Constitutional: She is oriented to person, place, and time. She appears well-developed and well-nourished.  Eyes: Conjunctivae are normal. No  scleral icterus.  Neck: Neck supple.  Cardiovascular: Normal rate, regular rhythm and normal heart sounds.   Respiratory: Effort normal and breath sounds normal.  GI: Soft. Bowel sounds are normal. She exhibits no distension and no mass. There is no tenderness.  Neurological: She is alert and oriented to person, place, and time.  Skin: Skin is warm and dry.     Assessment/Plan  Proceed with colonoscopy as planned. Procedure, risks and nenefits have been discussd fully with pt on her OV of 11/28/15  Christene Lye, MD 02/05/2016, 7:44 AM

## 2016-02-19 ENCOUNTER — Telehealth: Payer: Self-pay | Admitting: *Deleted

## 2016-02-19 MED ORDER — POLYETHYLENE GLYCOL 3350 17 GM/SCOOP PO POWD: ORAL | Status: DC

## 2016-02-19 NOTE — Telephone Encounter (Signed)
Patient contacted today to see if she wants to reschedule colonoscopy since she was unable to have on 02-05-16 due to lab work.   The patient states that she does want to reschedule but wants to wait till June since her first grandchild is due soon.  Patient has been scheduled for a colonoscopy on 04-01-16 at [facility].   Miralax prescription has been resent to patient's pharmacy today.

## 2016-03-31 ENCOUNTER — Telehealth: Payer: Self-pay

## 2016-03-31 NOTE — Telephone Encounter (Signed)
Patient called to reschedule her colonoscopy scheduled for 04/01/16. The patient is scheduled for a Colonoscopy at [facility] on 05/13/16. They are aware to call the day before to get their arrival time. The patient has her Miralax prescription already. The patient is aware of date and instructions.

## 2016-03-31 NOTE — Telephone Encounter (Signed)
Correction The patient is scheduled for a Colonoscopy at [facility] on 04/06/16. They are aware to call the day before to get their arrival time. The patient is aware of date and instructions.

## 2016-04-29 ENCOUNTER — Other Ambulatory Visit: Payer: Self-pay | Admitting: General Surgery

## 2016-05-04 ENCOUNTER — Telehealth: Payer: Self-pay | Admitting: *Deleted

## 2016-05-04 NOTE — Telephone Encounter (Signed)
Patient called the office wanting to change the date for her colonoscopy due to her schedule.   This patient's colonoscopy has been moved from 05-06-16 to 06-10-16.  Scheduling form faxed to the Endoscopy Department regarding date change.   *Patient notified this will probably be the last reschedule. If patient calls to change date, we will need to check with Dr. Jamal Collin to see if we can reschedule.

## 2016-06-10 ENCOUNTER — Encounter: Admission: RE | Payer: Self-pay | Source: Ambulatory Visit

## 2016-06-10 ENCOUNTER — Ambulatory Visit: Admission: RE | Admit: 2016-06-10 | Payer: Medicaid Other | Source: Ambulatory Visit | Admitting: General Surgery

## 2016-06-10 SURGERY — COLONOSCOPY WITH PROPOFOL
Anesthesia: General

## 2017-01-29 ENCOUNTER — Encounter: Payer: Self-pay | Admitting: Certified Nurse Midwife

## 2017-02-01 ENCOUNTER — Encounter: Payer: Self-pay | Admitting: *Deleted

## 2017-02-02 ENCOUNTER — Other Ambulatory Visit: Payer: Self-pay

## 2017-02-02 ENCOUNTER — Ambulatory Visit (INDEPENDENT_AMBULATORY_CARE_PROVIDER_SITE_OTHER): Payer: Medicaid Other | Admitting: Certified Nurse Midwife

## 2017-02-02 VITALS — BP 116/78 | HR 67 | Ht 61.0 in | Wt 114.5 lb

## 2017-02-02 DIAGNOSIS — Z8742 Personal history of other diseases of the female genital tract: Secondary | ICD-10-CM

## 2017-02-02 DIAGNOSIS — R1032 Left lower quadrant pain: Secondary | ICD-10-CM | POA: Diagnosis not present

## 2017-02-02 NOTE — Patient Instructions (Signed)

## 2017-02-05 ENCOUNTER — Other Ambulatory Visit: Payer: Self-pay | Admitting: Obstetrics and Gynecology

## 2017-02-05 DIAGNOSIS — R102 Pelvic and perineal pain: Secondary | ICD-10-CM

## 2017-02-06 ENCOUNTER — Emergency Department
Admission: EM | Admit: 2017-02-06 | Discharge: 2017-02-06 | Disposition: A | Discharge status: Home / Self Care | Payer: Medicaid Other | Attending: Emergency Medicine | Admitting: Emergency Medicine

## 2017-02-06 ENCOUNTER — Encounter: Payer: Self-pay | Admitting: Medical Oncology

## 2017-02-06 ENCOUNTER — Emergency Department: Payer: Medicaid Other

## 2017-02-06 DIAGNOSIS — I10 Essential (primary) hypertension: Secondary | ICD-10-CM | POA: Insufficient documentation

## 2017-02-06 DIAGNOSIS — W228XXA Striking against or struck by other objects, initial encounter: Secondary | ICD-10-CM | POA: Insufficient documentation

## 2017-02-06 DIAGNOSIS — Y9389 Activity, other specified: Secondary | ICD-10-CM | POA: Insufficient documentation

## 2017-02-06 DIAGNOSIS — S59902A Unspecified injury of left elbow, initial encounter: Secondary | ICD-10-CM | POA: Diagnosis present

## 2017-02-06 DIAGNOSIS — S5012XA Contusion of left forearm, initial encounter: Secondary | ICD-10-CM | POA: Diagnosis not present

## 2017-02-06 DIAGNOSIS — Y929 Unspecified place or not applicable: Secondary | ICD-10-CM | POA: Diagnosis not present

## 2017-02-06 DIAGNOSIS — F17210 Nicotine dependence, cigarettes, uncomplicated: Secondary | ICD-10-CM | POA: Insufficient documentation

## 2017-02-06 DIAGNOSIS — S5002XA Contusion of left elbow, initial encounter: Secondary | ICD-10-CM | POA: Insufficient documentation

## 2017-02-06 DIAGNOSIS — Y990 Civilian activity done for income or pay: Secondary | ICD-10-CM | POA: Insufficient documentation

## 2017-02-06 MED ORDER — NAPROXEN 500 MG PO TABS: 500.0000 mg | ORAL_TABLET | Freq: Two times a day (BID) | ORAL | Status: DC

## 2017-02-06 NOTE — ED Provider Notes (Signed)
[facility] Emergency Department Provider Note   ____________________________________________   First MD Initiated Contact with Patient 02/06/17 1653     (approximate)  I have reviewed the triage vital signs and the nursing notes.   HISTORY  Chief Complaint Arm Pain    HPI Amy Waller is a 56 y.o. female patient complain left elbow pain for two week status post contusion at work. State hit left elbow on metal pole. Seen by PCP and given pain medication which has not resolved compliant. Rates pain as 8/10. No other palliative measures for compliant.   Past Medical History:  Diagnosis Date  . Arthritis   . Dog bite 11-24-15   right thigh  . Hypertension   . Vaginal discharge     There are no active problems to display for this patient.   Past Surgical History:  Procedure Laterality Date  . [place]    Prior to Admission medications   Medication Sig Start Date End Date Taking? Authorizing Provider  amoxicillin-clavulanate (AUGMENTIN) 875-125 MG tablet Take 1 tablet by mouth 2 (two) times daily. Patient not taking: Reported on 02/05/2016 11/18/15   Jami L Hagler, PA-C  metoprolol tartrate (LOPRESSOR) 25 MG tablet Take by mouth.    Historical Provider, MD  naproxen (NAPROSYN) 500 MG tablet Take 1 tablet (500 mg total) by mouth 2 (two) times daily with a meal. 02/06/17   Sable Feil, PA-C  polyethylene glycol powder (GLYCOLAX/MIRALAX) powder 255 grams one bottle for colonoscopy prep Patient not taking: Reported on 02/02/2017 02/19/16   Seeplaputhur Robinette Haines, MD    Allergies Patient has no known allergies.  Family History  Problem Relation Age of Onset  . Cancer Mother 9    ovarian  . Cancer Sister 53    throat  . Cancer Maternal Uncle     throat    Social History Social History  Substance Use Topics  . Smoking status: Current Every Day Smoker    Packs/day: 0.50    Years: 10.00    Types: Cigarettes  .  Smokeless tobacco: Never Used  . Alcohol use No     Comment: occasionally    Review of Systems Constitutional: No fever/chills Eyes: No visual changes. ENT: No sore throat. Cardiovascular: Denies chest pain. Respiratory: Denies shortness of breath. Gastrointestinal: No abdominal pain.  No nausea, no vomiting.  No diarrhea.  No constipation. Genitourinary: Negative for dysuria. Musculoskeletal: Negative for back pain. Skin: Negative for rash. Neurological: Negative for headaches, focal weakness or numbness. Endocrine:Hypertension.  ____________________________________________   PHYSICAL EXAM:  VITAL SIGNS: ED Triage Vitals  Enc Vitals Group     BP 02/06/17 1617 (!) 125/94     Pulse Rate 02/06/17 1617 85     Resp 02/06/17 1617 16     Temp 02/06/17 1617 98.5 F (36.9 C)     Temp Source 02/06/17 1617 Oral     SpO2 02/06/17 1617 98 %     Weight 02/06/17 1617 114 lb (51.7 kg)     Height 02/06/17 1617 5\' 1"  (1.549 m)     Head Circumference --      Peak Flow --      Pain Score 02/06/17 1616 8     Pain Loc --      Pain Edu? --      Excl. in [place]? --     Constitutional: Alert and oriented. Well appearing and in no acute distress. Eyes: Conjunctivae are normal. PERRL. EOMI.  Head: Atraumatic. Nose: No congestion/rhinnorhea. Mouth/Throat: Mucous membranes are moist.  Oropharynx non-erythematous. Neck: No stridor.  No cervical spine tenderness to palpation. Hematological/Lymphatic/Immunilogical: No cervical lymphadenopathy. Cardiovascular: Normal rate, regular rhythm. Grossly normal heart sounds.  Good peripheral circulation. Respiratory: Normal respiratory effort.  No retractions. Lungs CTAB. Gastrointestinal: Soft and nontender. No distention. No abdominal bruits. No CVA tenderness. Musculoskeletal: No lower extremity tenderness nor edema.  No joint effusions. Neurologic:  Normal speech and language. No gross focal neurologic deficits are appreciated. No gait  instability. Skin:  Skin is warm, dry and intact. No rash noted. Psychiatric: Mood and affect are normal. Speech and behavior are normal.  ____________________________________________   LABS (all labs ordered are listed, but only abnormal results are displayed)  Labs Reviewed - No data to display ____________________________________________  EKG   ____________________________________________  RADIOLOGY  No acute findings on X-ray of left elbow ____________________________________________   PROCEDURES  Procedure(s) performed: None  Procedures  Critical Care performed: No  ____________________________________________   INITIAL IMPRESSION / ASSESSMENT AND PLAN / ED COURSE  Pertinent labs & imaging results that were available during my care of the patient were reviewed by me and considered in my medical decision making (see chart for details).  Left elbow bursitis.      ____________________________________________   FINAL CLINICAL IMPRESSION(S) / ED DIAGNOSES  Final diagnoses:  Contusion of left elbow and forearm, initial encounter  Patient given discharge care instructions and advised to follow up with PCP.    NEW MEDICATIONS STARTED DURING THIS VISIT:  New Prescriptions   NAPROXEN (NAPROSYN) 500 MG TABLET    Take 1 tablet (500 mg total) by mouth 2 (two) times daily with a meal.     Note:  This document was prepared using Dragon voice recognition software and may include unintentional dictation errors.    Sable Feil, PA-C 02/06/17 [place], MD 02/07/17 [phone]

## 2017-02-06 NOTE — ED Notes (Signed)
E-signature pad not functional. Patient was ready to leave.

## 2017-02-06 NOTE — ED Notes (Signed)
Patient states she injured her arm on the job 2 weeks ago and was prescribed pain medicine. But pain has persisted. Patient has full AROM with + distal neuro vascular

## 2017-02-06 NOTE — ED Triage Notes (Signed)
Pt was at work last week and hit her left elbow on metal pole, reports pain to area.

## 2017-02-11 ENCOUNTER — Other Ambulatory Visit: Payer: Medicaid Other

## 2017-02-11 MED ORDER — METRONIDAZOLE 0.75 % VA GEL: 1.0000 | Freq: Every day | VAGINAL | 5 refills | Status: DC

## 2017-02-11 MED ORDER — METRONIDAZOLE 500 MG PO TABS: 500.0000 mg | ORAL_TABLET | Freq: Two times a day (BID) | ORAL | 0 refills | Status: AC

## 2017-02-11 NOTE — Progress Notes (Signed)
GYN ENCOUNTER NOTE  Subjective:       Amy Waller is a 56 y.o. G6P0010 female  here for gynecologic evaluation of recurrent BV and left lower quadrant pain. She was referred by Dr. Brynda Waller.   Amy Waller has been treated for BV at least five (5) times this year. She currently has light yellow vaginal discharge.     She reports a sharp pain in her left lower quadrant that is present all the time and intermittent increased pain during intercourse.   Her last menstrual cycle was approximately eight (8) months ago. She has been in a committed relationship for the last four (4) years. She is sexually active.   Amy Waller uses lubricated condoms, vaginal creams, occasionally douches, washes with scented soaps, and uses a razor for hair removal.   Denies difficulty breathing or respiratory distress, chest pain, dysuria, vaginal bleeding, and leg pain or swelling.    Gynecologic History  Patient's last menstrual period was 10/18/2015.   Contraception: condoms   Last Pap: 2017. Results were: normal  Last mammogram: 2013. Results were: normal  Obstetric History OB History  Gravida Para Term Preterm AB Living  4 2     1     SAB TAB Ectopic Multiple Live Births  1            # Outcome Date GA Lbr Len/2nd Weight Sex Delivery Anes PTL Lv  4 Gravida           3 SAB           2 Para           1 Para             Obstetric Comments  1st Menstrual Cycle:  14   1st Pregnancy: 25     Past Medical History:  Diagnosis Date  . Arthritis   . Dog bite 11-24-15   right thigh  . Hypertension   . Vaginal discharge     Past Surgical History:  Procedure Laterality Date  . [place]    Current Outpatient Prescriptions on File Prior to Visit  Medication Sig Dispense Refill  . metoprolol tartrate (LOPRESSOR) 25 MG tablet Take by mouth.    Marland Kitchen amoxicillin-clavulanate (AUGMENTIN) 875-125 MG tablet Take 1 tablet by mouth 2 (two) times daily. (Patient not taking: Reported on  02/05/2016) 14 tablet 0  . polyethylene glycol powder (GLYCOLAX/MIRALAX) powder 255 grams one bottle for colonoscopy prep (Patient not taking: Reported on 02/02/2017) 255 g 0   No current facility-administered medications on file prior to visit.     No Known Allergies  Social History   Social History  . Marital status: Single    Spouse name: N/A  . Number of children: N/A  . Years of education: N/A   Occupational History  . Not on file.   Social History Main Topics  . Smoking status: Current Every Day Smoker    Packs/day: 0.50    Years: 10.00    Types: Cigarettes  . Smokeless tobacco: Never Used  . Alcohol use No     Comment: occasionally  . Drug use: Yes  . Sexual activity: Yes   Other Topics Concern  . Not on file   Social History Narrative  . No narrative on file    Family History  Problem Relation Age of Onset  . Cancer Mother 40    ovarian  . Cancer Sister 53    throat  . Cancer Maternal Uncle  throat    The following portions of the patient's history were reviewed and updated as appropriate: allergies, current medications, past family history, past medical history, past social history, past surgical history and problem list.  Review of Systems  Review of Systems - Negative except as noted above.  History obtained from the patient.   Objective:   BP 116/78   Pulse 67   Ht 5\' 1"  (1.549 m)   Wt 114 lb 8 oz (51.9 kg)   LMP 10/18/2015   BMI 21.63 kg/m    Alert and oriented x 4, no apparent distress.   ABDOMEN: Soft, non distended; Non tender.  No Organomegaly.  PELVIC:  External Genitalia: Normal  Vagina: Normal  Cervix: Normal  Uterus: Normal size, shape,consistency, mobile  Adnexa: Normal  Assessment:   1. Right lower quadrant pain   2. History of recurrent vaginal discharge  Plan:   NuSwab collected, will contact pt with results.   Rx Flagyl, see orders.  Transvaginal US, see orders.   Encouraged vaginal health techniques.    Reviewed red flag symptoms and when to call.   RTC after [place] to discuss results.    Amy Waller, CNM

## 2017-02-12 ENCOUNTER — Ambulatory Visit (INDEPENDENT_AMBULATORY_CARE_PROVIDER_SITE_OTHER): Payer: Medicaid Other

## 2017-02-12 DIAGNOSIS — R102 Pelvic and perineal pain: Secondary | ICD-10-CM | POA: Diagnosis not present

## 2017-03-12 ENCOUNTER — Emergency Department
Admission: EM | Admit: 2017-03-12 | Discharge: 2017-03-12 | Discharge status: Against Medical Advice | Payer: Medicaid Other | Attending: Dermatology | Admitting: Dermatology

## 2017-03-12 ENCOUNTER — Encounter: Payer: Self-pay | Admitting: Emergency Medicine

## 2017-03-12 DIAGNOSIS — N898 Other specified noninflammatory disorders of vagina: Secondary | ICD-10-CM | POA: Diagnosis not present

## 2017-03-12 DIAGNOSIS — Z202 Contact with and (suspected) exposure to infections with a predominantly sexual mode of transmission: Secondary | ICD-10-CM | POA: Diagnosis not present

## 2017-03-12 DIAGNOSIS — R300 Dysuria: Secondary | ICD-10-CM | POA: Diagnosis present

## 2017-03-12 LAB — URINALYSIS, COMPLETE (UACMP) WITH MICROSCOPIC
BILIRUBIN URINE: NEGATIVE
Glucose, UA: NEGATIVE mg/dL
Ketones, ur: NEGATIVE mg/dL
LEUKOCYTES UA: NEGATIVE
Nitrite: NEGATIVE
PH: 7 (ref 5.0–8.0)
Protein, ur: NEGATIVE mg/dL
SPECIFIC GRAVITY, URINE: 1.014 (ref 1.005–1.030)

## 2017-03-12 NOTE — ED Triage Notes (Signed)
Pt reports was treated by the health dept for STD on 5/19, states partner was tested at health dept and treated for STD but unsure if it was gonorrhea or chlamydia. Pt reports still having symptoms. States dysuria and white discharge. Denies urinary frequency.

## 2017-03-16 ENCOUNTER — Encounter: Payer: Self-pay | Admitting: Certified Nurse Midwife

## 2017-03-16 ENCOUNTER — Ambulatory Visit (INDEPENDENT_AMBULATORY_CARE_PROVIDER_SITE_OTHER): Payer: Medicaid Other | Admitting: Certified Nurse Midwife

## 2017-03-16 VITALS — BP 113/81 | HR 67 | Wt 109.4 lb

## 2017-03-16 DIAGNOSIS — Z09 Encounter for follow-up examination after completed treatment for conditions other than malignant neoplasm: Secondary | ICD-10-CM

## 2017-03-16 DIAGNOSIS — Z8619 Personal history of other infectious and parasitic diseases: Secondary | ICD-10-CM

## 2017-03-16 NOTE — Progress Notes (Signed)
GYN ENCOUNTER NOTE  Subjective:       Amy Waller is a 56 y.o. [id number] female here for test of cure and results review.   Previously treated for trichamonas and recurrent BV by myself on 03/06/2017. She reports little change to previous symptoms. Both patient and partner have completed treatment.   Denies difficulty breathing or respiratory distress, chest pain, abdominal pain, dysuria, and leg pain or swelling.    Gynecologic History  Patient's last menstrual period was 10/18/2015.   Contraception: condoms   Last Pap: 2017. Results were: normal  Last mammogram: 2013. Results were: normal  Obstetric History OB History  Gravida Para Term Preterm AB Living  4 2     1     SAB TAB Ectopic Multiple Live Births  1            # Outcome Date GA Lbr Len/2nd Weight Sex Delivery Anes PTL Lv  4 Gravida           3 SAB           2 Para           1 Para             Obstetric Comments  1st Menstrual Cycle:  14   1st Pregnancy: 25     Past Medical History:  Diagnosis Date  . Arthritis   . Dog bite 11-24-15   right thigh  . Hypertension   . Vaginal discharge     Past Surgical History:  Procedure Laterality Date  . [place]    Current Outpatient Prescriptions on File Prior to Visit  Medication Sig Dispense Refill  . amoxicillin-clavulanate (AUGMENTIN) 875-125 MG tablet Take 1 tablet by mouth 2 (two) times daily. (Patient not taking: Reported on 02/05/2016) 14 tablet 0  . metoprolol tartrate (LOPRESSOR) 25 MG tablet Take by mouth.    . metroNIDAZOLE (METROGEL) 0.75 % vaginal gel Place 1 Applicatorful vaginally at bedtime. Apply one (1) applicatorful to vagina at bedtime twice a week for four (4) months. 70 g 5  . naproxen (NAPROSYN) 500 MG tablet Take 1 tablet (500 mg total) by mouth 2 (two) times daily with a meal. 20 tablet 00  . polyethylene glycol powder (GLYCOLAX/MIRALAX) powder 255 grams one bottle for colonoscopy prep (Patient not taking: Reported  on 02/02/2017) 255 g 0   No current facility-administered medications on file prior to visit.     No Known Allergies  Social History   Social History  . Marital status: Single    Spouse name: N/A  . Number of children: N/A  . Years of education: N/A   Occupational History  . Not on file.   Social History Main Topics  . Smoking status: Current Every Day Smoker    Packs/day: 0.50    Years: 10.00    Types: Cigarettes  . Smokeless tobacco: Never Used  . Alcohol use No     Comment: occasionally  . Drug use: Yes  . Sexual activity: Yes   Other Topics Concern  . Not on file   Social History Narrative  . No narrative on file    Family History  Problem Relation Age of Onset  . Cancer Mother 85       ovarian  . Cancer Sister 53       throat  . Cancer Maternal Uncle        throat    The following portions of the patient's history were reviewed  and updated as appropriate: allergies, current medications, past family history, past medical history, past social history, past surgical history and problem list.  Review of Systems  Review of Systems - Negative except as noted above.  History obtained from the patient.  Objective:   BP 113/81   Pulse 67   Wt 109 lb 6.4 oz (49.6 kg)   LMP 10/18/2015   BMI 20.67 kg/m   CONSTITUTIONAL: Well-developed, well-nourished female in no acute distress.   PELVIC:  External Genitalia: Normal  BUS: Normal  Vagina: Normal  Cervix: Normal, NuSwab collected  ULTRASOUND REPORT  Location: [facility] Date of Service: 02/12/17   Indications: Pelvic Pain Findings:  The uterus is anteflexed and measures 8.4 x 4.2 x 5.5 cm. Echo texture is diffusely heterogenous. There is definitely one fibroid seen, however, due to the heterogenicity of the uterus, it is difficult to determine if there are other fibroids. There is no defined border, but the echotexture and overall size of the uterus may indicate more fibroids versus  other etiology.  Fibroid 1: left fundal, intramural and measures 0.8 x 0.8 x 0.6 cm.  The Endometrium is difficult to visualize, but appears to measure 5.6 mm.  Right Ovary measures 2.7 x 1.9 x 1.6 cm, and appears WNL. Left ovary is not visualized, but a thorough survey of the left adnexa appears WNL. Survey of the adnexa demonstrates no adnexal masses. There is no free fluid in the cul de sac.  Impression: 1. Fibroid uterus with diffuse heterogenicity throughout. 2. Endometrium 5.6 mm 3. Lt ovary not visualized.  Recommendations: 1.Clinical correlation with the patient's History and Physical Exam.   Assessment:   1. History of trichomoniasis  - NuSwab Vaginitis Plus (VG+)  2. Follow up  Plan:   NuSwab collected, will contact patient with results.   F/U x six (6) months to one (1) year to evaluate size of uterine fibroid.   Reviewed red flag symptoms and when to call.    Diona Fanti, CNM

## 2017-03-16 NOTE — Patient Instructions (Signed)
Uterine Fibroids Uterine fibroids are tissue masses (tumors). They are also called leiomyomas. They can develop inside of a woman's womb (uterus). They can grow very large. Fibroids are not cancerous (benign). Most fibroids do not require medical treatment. Follow these instructions at home:  Keep all follow-up visits as told by your doctor. This is important.  Take medicines only as told by your doctor.  If you were prescribed a hormone treatment, take the hormone medicines exactly as told.  Do not take aspirin. It can cause bleeding.  Ask your doctor about taking iron pills and increasing the amount of dark green, leafy vegetables in your diet. These actions can help to boost your blood iron levels.  Pay close attention to your period. Tell your doctor about any changes, such as:  Increased blood flow. This may require you to use more pads or tampons than usual per month.  A change in the number of days that your period lasts per month.  A change in symptoms that come with your period, such as back pain or cramping in your belly area (abdomen). Contact a doctor if:  You have pain in your back or the area between your hip bones (pelvic area) that is not controlled by medicines.  You have pain in your abdomen that is not controlled with medicines.  You have an increase in bleeding between and during periods.  You soak tampons or pads in a half hour or less.  You feel lightheaded.  You feel extra tired.  You feel weak. Get help right away if:  You pass out (faint).  You have a sudden increase in pelvic pain. This information is not intended to replace advice given to you by your health care provider. Make sure you discuss any questions you have with your health care provider. Document Released: 11/07/2010 Document Revised: 06/05/2016 Document Reviewed: 04/03/2014 Elsevier Interactive Patient Education  2017 [employer].

## 2017-03-21 LAB — NUSWAB VAGINITIS PLUS (VG+)
CANDIDA ALBICANS, NAA: POSITIVE — AB
Candida glabrata, NAA: NEGATIVE
Chlamydia trachomatis, NAA: NEGATIVE
NEISSERIA GONORRHOEAE, NAA: NEGATIVE
Trich vag by NAA: NEGATIVE

## 2017-03-22 ENCOUNTER — Telehealth: Payer: Self-pay | Admitting: Certified Nurse Midwife

## 2017-03-22 MED ORDER — FLUCONAZOLE 150 MG PO TABS: 150.0000 mg | ORAL_TABLET | Freq: Once | ORAL | 0 refills | Status: AC

## 2017-03-22 NOTE — Telephone Encounter (Signed)
Pt called office inquiring about test results.   Verified full name and date of birth. Results reviewed, positive yeast. Rx Diflucan, see orders.   Will send information about uterine fibroids and myomectomy via snail mail.   Contact with further needs, questions, or concerns,   Dani Gobble, CNM

## 2017-03-22 NOTE — Progress Notes (Signed)
Would you contact pt with results. Yeast only. May have diflucan 150 mg PO, 2 tablets, take one now and again in three days if symptoms continue. Remind to set up MyChart. Thanks, JML

## 2017-09-16 ENCOUNTER — Encounter: Payer: Medicaid Other | Admitting: Certified Nurse Midwife

## 2017-11-16 ENCOUNTER — Encounter: Payer: Medicaid Other | Admitting: Certified Nurse Midwife

## 2017-11-19 ENCOUNTER — Encounter: Payer: Medicaid Other | Admitting: Certified Nurse Midwife

## 2018-07-25 ENCOUNTER — Ambulatory Visit (INDEPENDENT_AMBULATORY_CARE_PROVIDER_SITE_OTHER): Payer: Medicaid Other | Admitting: Certified Nurse Midwife

## 2018-07-25 ENCOUNTER — Other Ambulatory Visit (HOSPITAL_COMMUNITY)
Admission: RE | Admit: 2018-07-25 | Discharge: 2018-07-25 | Disposition: A | Discharge status: Home / Self Care | Payer: Medicaid Other | Source: Ambulatory Visit | Attending: Certified Nurse Midwife | Admitting: Certified Nurse Midwife

## 2018-07-25 ENCOUNTER — Encounter: Payer: Self-pay | Admitting: Certified Nurse Midwife

## 2018-07-25 VITALS — BP 114/94 | HR 72 | Ht 61.0 in | Wt 109.1 lb

## 2018-07-25 DIAGNOSIS — Z8742 Personal history of other diseases of the female genital tract: Secondary | ICD-10-CM | POA: Diagnosis not present

## 2018-07-25 DIAGNOSIS — R102 Pelvic and perineal pain: Secondary | ICD-10-CM

## 2018-07-25 DIAGNOSIS — N941 Unspecified dyspareunia: Secondary | ICD-10-CM

## 2018-07-25 DIAGNOSIS — R300 Dysuria: Secondary | ICD-10-CM | POA: Diagnosis not present

## 2018-07-25 DIAGNOSIS — R8290 Unspecified abnormal findings in urine: Secondary | ICD-10-CM

## 2018-07-25 LAB — POCT URINALYSIS DIPSTICK
Bilirubin, UA: NEGATIVE
GLUCOSE UA: NEGATIVE
Ketones, UA: NEGATIVE
LEUKOCYTES UA: NEGATIVE
Nitrite, UA: NEGATIVE
PH UA: 5 (ref 5.0–8.0)
Protein, UA: POSITIVE — AB
Spec Grav, UA: 1.025 (ref 1.010–1.025)
UROBILINOGEN UA: 0.2 U/dL

## 2018-07-25 MED ORDER — NITROFURANTOIN MONOHYD MACRO 100 MG PO CAPS: 100.0000 mg | ORAL_CAPSULE | Freq: Two times a day (BID) | ORAL | 1 refills | Status: DC

## 2018-07-25 MED ORDER — PHENAZOPYRIDINE HCL 200 MG PO TABS: 200.0000 mg | ORAL_TABLET | Freq: Three times a day (TID) | ORAL | 1 refills | Status: DC | PRN

## 2018-07-25 NOTE — Patient Instructions (Addendum)
Pelvic Pain, Female Pelvic pain is pain in your lower belly (abdomen), below your belly button and between your hips. The pain may start suddenly (acute), keep coming back (recurring), or last a long time (chronic). Pelvic pain that lasts longer than six months is considered chronic. There are many causes of pelvic pain. Sometimes the cause of your pelvic pain is not known. Follow these instructions at home:  Take over-the-counter and prescription medicines only as told by your doctor.  Rest as told by your doctor.  Do not have sex it if hurts.  Keep a journal of your pelvic pain. Write down: ? When the pain started. ? Where the pain is located. ? What seems to make the pain better or worse, such as food or your menstrual cycle. ? Any symptoms you have along with the pain.  Keep all follow-up visits as told by your doctor. This is important. Contact a doctor if:  Medicine does not help your pain.  Your pain comes back.  You have new symptoms.  You have unusual vaginal discharge or bleeding.  You have a fever or chills.  You are having a hard time pooping (constipation).  You have blood in your pee (urine) or poop (stool).  Your pee smells bad.  You feel weak or lightheaded. Get help right away if:  You have sudden pain that is very bad.  Your pain continues to get worse.  You have very bad pain and also have any of the following symptoms: ? A fever. ? Feeling stick to your stomach (nausea). ? Throwing up (vomiting). ? Being very sweaty.  You pass out (lose consciousness). This information is not intended to replace advice given to you by your health care provider. Make sure you discuss any questions you have with your health care provider. Document Released: 03/23/2008 Document Revised: 10/30/2015 Document Reviewed: 07/26/2015 Elsevier Interactive Patient Education  2018 [employer]. Dysuria Dysuria is pain or discomfort while urinating. The pain or discomfort  may be felt in the tube that carries urine out of the bladder (urethra) or in the surrounding tissue of the genitals. The pain may also be felt in the groin area, lower abdomen, and lower back. You may have to urinate frequently or have the sudden feeling that you have to urinate (urgency). Dysuria can affect both men and women, but is more common in women. Dysuria can be caused by many different things, including:  Urinary tract infection in women.  Infection of the kidney or bladder.  Kidney stones or bladder stones.  Certain sexually transmitted infections (STIs), such as chlamydia.  Dehydration.  Inflammation of the vagina.  Use of certain medicines.  Use of certain soaps or scented products that cause irritation.  Follow these instructions at home: Watch your dysuria for any changes. The following actions may help to reduce any discomfort you are feeling:  Drink enough fluid to keep your urine clear or pale yellow.  Empty your bladder often. Avoid holding urine for long periods of time.  After a bowel movement or urination, women should cleanse from front to back, using each tissue only once.  Empty your bladder after sexual intercourse.  Take medicines only as directed by your health care provider.  If you were prescribed an antibiotic medicine, finish it all even if you start to feel better.  Avoid caffeine, tea, and alcohol. They can irritate the bladder and make dysuria worse. In men, alcohol may irritate the prostate.  Keep all follow-up visits as  directed by your health care provider. This is important.  If you had any tests done to find the cause of dysuria, it is your responsibility to obtain your test results. Ask the lab or department performing the test when and how you will get your results. Talk with your health care provider if you have any questions about your results.  Contact a health care provider if:  You develop pain in your back or sides.  You  have a fever.  You have nausea or vomiting.  You have blood in your urine.  You are not urinating as often as you usually do. Get help right away if:  You pain is severe and not relieved with medicines.  You are unable to hold down any fluids.  You or someone else notices a change in your mental function.  You have a rapid heartbeat at rest.  You have shaking or chills.  You feel extremely weak. This information is not intended to replace advice given to you by your health care provider. Make sure you discuss any questions you have with your health care provider. Document Released: 07/03/2004 Document Revised: 03/12/2016 Document Reviewed: 05/31/2014 Elsevier Interactive Patient Education  2018 [employer]. Dyspareunia, Female Dyspareunia is pain that is associated with sexual activity. This can affect any part of the genitals or lower abdomen, and there are many possible causes. This condition ranges from mild to severe. Depending on the cause, dyspareunia may get better with treatment, or it may return (recur) over time. What are the causes? The cause of this condition is not always known. Possible causes include:  Cancer.  Psychological factors, such as depression, anxiety, or previous traumatic experiences.  Severe pain and tenderness of the skin around the vagina (vulva) when it is touched (vulvar vestibulitis syndrome).  Infection of the pelvis or the vulva.  Infection of the vagina.  Painful, involuntary tightening (contraction) of the vaginal muscles when anything is put inside the vagina (vaginismus).  Allergic reaction.  Ovarian cysts.  Solid growths of tissue (tumors) in the ovaries or the uterus.  Scar tissue in the ovaries, vagina, or pelvis.  Vaginal dryness.  Thinning of the tissue (atrophy) of the vulva and vagina.  Skin conditions that affect the vulva (vulvar dermatoses), such as lichen sclerosus or lichen planus.  Endometriosis.  Tubal  pregnancy.  A tilted uterus.  Uterine prolapse.  Adhesions in the vagina.  Bladder problems.  Intestinal problems.  Certain medicines.  Medical conditions such as diabetes, arthritis, or thyroid disease.  What increases the risk? The following factors may make you more likely to develop this condition:  Having experienced physical or sexual trauma.  Having given birth more than once.  Taking birth control pills.  Having gone through menopause.  Having recently given birth, typically within the past 3-6 months.  Breastfeeding.  What are the signs or symptoms? The main symptom of this condition is pain in any part of the genitals or lower abdomen during or after sexual activity. This may include pain during sexual arousal, genital stimulation, or orgasm. Pain may get worse when anything is inserted into the vagina, or when the genitals are touched in any way, such as when sitting or wearing pants. Pain can range from mild to severe, depending on the cause of the condition. In some cases, symptoms go away with treatment and return (recur) at a later date. How is this diagnosed? This condition may be diagnosed based on:  Your symptoms, including: ? Where your  pain is located. ? When your pain occurs.  Your medical history.  A physical exam. This may include a pelvic exam and a Pap test. This is a screening test that is used to check for signs of cancer of the vagina, cervix, and uterus.  Tests, including: ? Blood tests. ? Ultrasound. This uses sound waves to make a picture of the area that is being tested. ? Urine culture. This test involves checking a urine sample for signs of infection. ? Culture test. This is when your health care provider uses a swab to collect a sample of vaginal fluid. The sample is checked for signs of infection. ? X-rays. ? MRI. ? CT scan. ? Laparoscopy. This is a procedure in which a small incision is made in your lower abdomen and a lighted,  pencil-sized instrument (laparoscope) is passed through the incision and used to look inside your pelvis.  You may be referred to a health care provider who specializes in women's health (gynecologist). In some cases, diagnosing the cause of dyspareunia can be difficult. How is this treated? Treatment depends on the cause of your condition and your symptoms. In most cases, you may need to stop sexual activity until your symptoms improve. Treatment may include:  Lubricants.  Kegel exercises or vaginal dilators.  Medicated skin creams.  Medicated vaginal creams.  Hormonal therapy.  Antibiotic medicine to prevent or fight infection.  Medicines that help to relieve pain.  Medicines that treat depression (antidepressants).  Psychological counseling.  Sex therapy.  Surgery.  Follow these instructions at home: Lifestyle  Avoid tight clothing and irritating materials around your genital and abdominal area.  Use water-based lubricants as needed. Avoid oil-based lubricants.  Do not use any products that irritate you. This may include certain condoms, spermicides, lubricants, soaps, tampons, vaginal sprays, or douches.  Always practice safe sex. Talk with your health care provider about which form of birth control (contraception) is best for you.  Maintain open communication with your sexual partner. General instructions  Take over-the-counter and prescription medicines only as told by your health care provider.  If you had tests done, it is your responsibility to get your tests results. Ask your health care provider or the department performing the test when your results will be ready.  Urinate before you engage in sexual activity.  Consider joining a support group.  Keep all follow-up visits as told by your health care provider. This is important. Contact a health care provider if:  You develop vaginal bleeding after sexual intercourse.  You develop a lump at the opening  of your vagina. Seek medical care even if the lump is painless.  You have: ? Abnormal vaginal discharge. ? Vaginal dryness. ? Itchiness or irritation of your vulva or vagina. ? A new rash. ? Symptoms that get worse or do not improve with treatment. ? A fever. ? Pain when you urinate. ? Blood in your urine. Get help right away if:  You develop severe pain in your abdomen during or shortly after sexual intercourse.  You pass out after having sexual intercourse. This information is not intended to replace advice given to you by your health care provider. Make sure you discuss any questions you have with your health care provider. Document Released: 10/25/2007 Document Revised: 02/14/2016 Document Reviewed: 05/07/2015 Elsevier Interactive Patient Education  2018 [place] tablets What is this medicine? PHENAZOPYRIDINE (fen az oh PEER i deen) is a pain reliever. It is used to stop the pain, burning, or  discomfort caused by infection or irritation of the urinary tract. This medicine is not an antibiotic. It will not cure a urinary tract infection. This medicine may be used for other purposes; ask your health care provider or pharmacist if you have questions. COMMON BRAND NAME(S): AZO, Azo-100, Azo-Gesic, Azo-Septic, Azo-Standard, Phenazo, Prodium, Pyridium, Urinary Analgesic, Uristat What should I tell my health care provider before I take this medicine? They need to know if you have any of these conditions: -glucose-6-phosphate dehydrogenase (G6PD) deficiency -kidney disease -an unusual or allergic reaction to phenazopyridine, other medicines, foods, dyes, or preservatives -pregnant or trying to get pregnant -breast-feeding How should I use this medicine? Take this medicine by mouth with a glass of water. Follow the directions on the prescription label. Take after meals. Take your doses at regular intervals. Do not take your medicine more often than directed. Do not  skip doses or stop your medicine early even if you feel better. Do not stop taking except on your doctor's advice. Talk to your pediatrician regarding the use of this medicine in children. Special care may be needed. Overdosage: If you think you have taken too much of this medicine contact a poison control center or emergency room at once. NOTE: This medicine is only for you. Do not share this medicine with others. What if I miss a dose? If you miss a dose, take it as soon as you can. If it is almost time for your next dose, take only that dose. Do not take double or extra doses. What may interact with this medicine? Interactions are not expected. This list may not describe all possible interactions. Give your health care provider a list of all the medicines, herbs, non-prescription drugs, or dietary supplements you use. Also tell them if you smoke, drink alcohol, or use illegal drugs. Some items may interact with your medicine. What should I watch for while using this medicine? Tell your doctor or health care professional if your symptoms do not improve or if they get worse. This medicine colors body fluids red. This effect is harmless and will go away after you are done taking the medicine. It will change urine to an dark orange or red color. The red color may stain clothing. Soft contact lenses may become permanently stained. It is best not to wear soft contact lenses while taking this medicine. If you are diabetic you may get a false positive result for sugar in your urine. Talk to your health care provider. What side effects may I notice from receiving this medicine? Side effects that you should report to your doctor or health care professional as soon as possible: -allergic reactions like skin rash, itching or hives, swelling of the face, lips, or tongue -blue or purple color of the skin -difficulty breathing -fever -less urine -unusual bleeding, bruising -unusual tired,  weak -vomiting -yellowing of the eyes or skin Side effects that usually do not require medical attention (report to your doctor or health care professional if they continue or are bothersome): -dark urine -headache -stomach upset This list may not describe all possible side effects. Call your doctor for medical advice about side effects. You may report side effects to FDA at 1-800-FDA-1088. Where should I keep my medicine? Keep out of the reach of children. Store at room temperature between 15 and 30 degrees C (59 and 86 degrees F). Protect from light and moisture. Throw away any unused medicine after the expiration date. NOTE: This sheet is a summary. It may not  cover all possible information. If you have questions about this medicine, talk to your doctor, pharmacist, or health care provider.  2018 Elsevier/Gold Standard (2008-05-03 11:04:07)  Nitrofurantoin tablets or capsules What is this medicine? NITROFURANTOIN (nye troe fyoor AN toyn) is an antibiotic. It is used to treat urinary tract infections. This medicine may be used for other purposes; ask your health care provider or pharmacist if you have questions. COMMON BRAND NAME(S): Macrobid, Macrodantin, Urotoin What should I tell my health care provider before I take this medicine? They need to know if you have any of these conditions: -anemia -diabetes -glucose-6-phosphate dehydrogenase deficiency -kidney disease -liver disease -lung disease -other chronic illness -an unusual or allergic reaction to nitrofurantoin, other antibiotics, other medicines, foods, dyes or preservatives -pregnant or trying to get pregnant -breast-feeding How should I use this medicine? Take this medicine by mouth with a glass of water. Follow the directions on the prescription label. Take this medicine with food or milk. Take your doses at regular intervals. Do not take your medicine more often than directed. Do not stop taking except on your doctor's  advice. Talk to your pediatrician regarding the use of this medicine in children. While this drug may be prescribed for selected conditions, precautions do apply. Overdosage: If you think you have taken too much of this medicine contact a poison control center or emergency room at once. NOTE: This medicine is only for you. Do not share this medicine with others. What if I miss a dose? If you miss a dose, take it as soon as you can. If it is almost time for your next dose, take only that dose. Do not take double or extra doses. What may interact with this medicine? -antacids containing magnesium trisilicate -probenecid -quinolone antibiotics like ciprofloxacin, lomefloxacin, norfloxacin and ofloxacin -sulfinpyrazone This list may not describe all possible interactions. Give your health care provider a list of all the medicines, herbs, non-prescription drugs, or dietary supplements you use. Also tell them if you smoke, drink alcohol, or use illegal drugs. Some items may interact with your medicine. What should I watch for while using this medicine? Tell your doctor or health care professional if your symptoms do not improve or if you get new symptoms. Drink several glasses of water a day. If you are taking this medicine for a long time, visit your doctor for regular checks on your progress. If you are diabetic, you may get a false positive result for sugar in your urine with certain brands of urine tests. Check with your doctor. What side effects may I notice from receiving this medicine? Side effects that you should report to your doctor or health care professional as soon as possible: -allergic reactions like skin rash or hives, swelling of the face, lips, or tongue -chest pain -cough -difficulty breathing -dizziness, drowsiness -fever or infection -joint aches or pains -pale or blue-tinted skin -redness, blistering, peeling or loosening of the skin, including inside the mouth -tingling,  burning, pain, or numbness in hands or feet -unusual bleeding or bruising -unusually weak or tired -yellowing of eyes or skin Side effects that usually do not require medical attention (report to your doctor or health care professional if they continue or are bothersome): -dark urine -diarrhea -headache -loss of appetite -nausea or vomiting -temporary hair loss This list may not describe all possible side effects. Call your doctor for medical advice about side effects. You may report side effects to FDA at 1-800-FDA-1088. Where should I keep my medicine? Keep  out of the reach of children. Store at room temperature between 15 and 30 degrees C (59 and 86 degrees F). Protect from light. Throw away any unused medicine after the expiration date. NOTE: This sheet is a summary. It may not cover all possible information. If you have questions about this medicine, talk to your doctor, pharmacist, or health care provider.  2018 Elsevier/Gold Standard (2008-04-25 15:56:47)

## 2018-07-25 NOTE — Progress Notes (Signed)
GYN ENCOUNTER NOTE  Subjective:       Amy Waller is a 57 y.o. [id number] female is here for gynecologic evaluation of the following issues:  1. Pelvic pain 2. Dyspareunia with insertion and thrusting 3. Dysuria 4. Malodorous urine  Reports above symptoms for the last three (3) months. No relief with home treatment measures. Questions worsening fibroids or ovarian cysts.   Denies difficulty breathing or respiratory distress, chest pain, abdominal pain, vaginal bleeding, and leg pain or swelling.   First seen 01/2017 recurrent vaginitis and left lower quadrant pain. Encouraged extended use of Metrogel.    Gynecologic History  Patient's last menstrual period was 10/18/2015.  Contraception: condoms  Last Pap: 2017. Results were: normal  Last mammogram: 2013. Results were: normal  Last colonoscopy: due  Obstetric History  OB History  Gravida Para Term Preterm AB Living  4 2     1     SAB TAB Ectopic Multiple Live Births  1            # Outcome Date GA Lbr Len/2nd Weight Sex Delivery Anes PTL Lv  4 Gravida           3 SAB           2 Para           1 Para             Obstetric Comments  1st Menstrual Cycle:  14   1st Pregnancy: 25     Past Medical History:  Diagnosis Date  . Arthritis   . Dog bite 11-24-15   right thigh  . Hypertension   . Vaginal discharge     Past Surgical History:  Procedure Laterality Date  . [place]    Current Outpatient Medications on File Prior to Visit  Medication Sig Dispense Refill  . atenolol (TENORMIN) 50 MG tablet Take 50 mg by mouth daily. for high blood pressure  5  . lisinopril (PRINIVIL,ZESTRIL) 10 MG tablet Take 10 mg by mouth daily.     No current facility-administered medications on file prior to visit.     No Known Allergies  Social History   Socioeconomic History  . Marital status: Single    Spouse name: Not on file  . Number of children: Not on file  . Years of education: Not on file   . Highest education level: Not on file  Occupational History  . Not on file  Social Needs  . Financial resource strain: Not on file  . Food insecurity:    Worry: Not on file    Inability: Not on file  . Transportation needs:    Medical: Not on file    Non-medical: Not on file  Tobacco Use  . Smoking status: Current Every Day Smoker    Packs/day: 0.25    Years: 10.00    Pack years: 2.50    Types: Cigarettes  . Smokeless tobacco: Never Used  Substance and Sexual Activity  . Alcohol use: Yes    Comment: occasionally  . Drug use: Not Currently  . Sexual activity: Yes  Lifestyle  . Physical activity:    Days per week: Not on file    Minutes per session: Not on file  . Stress: Not on file  Relationships  . Social connections:    Talks on phone: Not on file    Gets together: Not on file    Attends religious service: Not on file  Active member of club or organization: Not on file    Attends meetings of clubs or organizations: Not on file    Relationship status: Not on file  . Intimate partner violence:    Fear of current or ex partner: Not on file    Emotionally abused: Not on file    Physically abused: Not on file    Forced sexual activity: Not on file  Other Topics Concern  . Not on file  Social History Narrative  . Not on file    Family History  Problem Relation Age of Onset  . Cancer Mother 30       ovarian  . Cancer Sister 53       throat  . Cancer Maternal Uncle        throat  . Breast cancer Maternal Aunt   . Colon cancer Neg Hx     The following portions of the patient's history were reviewed and updated as appropriate: allergies, current medications, past family history, past medical history, past social history, past surgical history and problem list.  Review of Systems  ROS negative except as noted above. Information obtained from patient.   Objective:   BP (!) 114/94   Pulse 72   Ht 5\' 1"  (1.549 m)   Wt 109 lb 1.6 oz (49.5 kg)   LMP  10/18/2015   BMI 20.61 kg/m    CONSTITUTIONAL: Well-developed, well-nourished female in no acute distress.   ABDOMEN: Soft, non distended; tenderness noted in left lower quadrant with palpation.  No Organomegaly.  PELVIC:  External Genitalia: Thin, white discharge present  Vagina: Thin, white discharge present  Cervix: Normal  Uterus: Normal size, shape,consistency, mobile  Adnexa: Normal  Assessment:   1. Pelvic pain  - Urine Culture  2. Dyspareunia, female   3. Dysuria  - Urine Culture  4. History of recurrent vaginal discharge   Plan:   Labs: Urine culture and NuSwab collect; will contact patient with results.   Rx: Macrobid and Pyridium, see orders.   Reviewed red flag symptoms and when to call.   RTC for ultrasound.    Diona Fanti, CNM [facility], [facility]

## 2018-07-26 ENCOUNTER — Other Ambulatory Visit: Payer: Self-pay | Admitting: Certified Nurse Midwife

## 2018-07-26 DIAGNOSIS — D219 Benign neoplasm of connective and other soft tissue, unspecified: Secondary | ICD-10-CM

## 2018-07-27 LAB — CERVICOVAGINAL ANCILLARY ONLY
BACTERIAL VAGINITIS: POSITIVE — AB
CANDIDA VAGINITIS: NEGATIVE
Chlamydia: NEGATIVE
Neisseria Gonorrhea: NEGATIVE
Trichomonas: POSITIVE — AB

## 2018-07-27 LAB — URINE CULTURE: Organism ID, Bacteria: NO GROWTH

## 2018-07-28 ENCOUNTER — Other Ambulatory Visit: Payer: Self-pay

## 2018-07-28 ENCOUNTER — Telehealth: Payer: Self-pay

## 2018-07-28 ENCOUNTER — Ambulatory Visit (INDEPENDENT_AMBULATORY_CARE_PROVIDER_SITE_OTHER): Payer: Medicaid Other

## 2018-07-28 DIAGNOSIS — D219 Benign neoplasm of connective and other soft tissue, unspecified: Secondary | ICD-10-CM

## 2018-07-28 DIAGNOSIS — R102 Pelvic and perineal pain: Secondary | ICD-10-CM | POA: Diagnosis not present

## 2018-07-28 MED ORDER — METRONIDAZOLE 500 MG PO TABS: 500.0000 mg | ORAL_TABLET | Freq: Two times a day (BID) | ORAL | 0 refills | Status: DC

## 2018-07-28 NOTE — Telephone Encounter (Signed)
-----   Message from Diona Fanti, CNM sent at 07/28/2018  3:44 PM EDT ----- Please contact patient. Vaginal swab positive for bacterial vaginosis and trichomonas, again. May have oral Flagyl 500 mg BID x 7 days with refill to given to partner. No alcohol. Resume metrogel twice a week after completion of oral medication. Than ks, JML

## 2018-07-28 NOTE — Telephone Encounter (Signed)
Left voicemail to return phone call, +bacterial vaginosis and trichomonas on nuswab per JML. See result notes on lab 07/25/2018. Rx sent to pharmacy on file.

## 2018-07-28 NOTE — Progress Notes (Signed)
Please contact patient. Vaginal swab positive for bacterial vaginosis and trichomonas, again. May have oral Flagyl 500 mg BID x 7 days with refill to given to partner. No alcohol. Resume metrogel twice a week after completion of oral medication. Thanks, JML

## 2018-08-01 ENCOUNTER — Telehealth: Payer: Self-pay

## 2018-08-01 NOTE — Telephone Encounter (Signed)
Error

## 2018-08-04 ENCOUNTER — Telehealth: Payer: Self-pay | Admitting: Certified Nurse Midwife

## 2018-08-04 NOTE — Progress Notes (Signed)
Left voicemail for patient to return my call. 

## 2018-08-04 NOTE — Telephone Encounter (Signed)
0922: HIPPA approved voicemail left on identified line asking patient to contact office.    Diona Fanti, CNM [facility], [facility]

## 2018-08-23 ENCOUNTER — Telehealth: Payer: Self-pay

## 2018-08-23 NOTE — Telephone Encounter (Signed)
Patient returned call after receiving letter in mail.  Patient aware of vaginal swab results, verbalized understanding.  Patient will pick up medication and stated she finished metrogel given.

## 2018-10-03 ENCOUNTER — Other Ambulatory Visit (HOSPITAL_COMMUNITY)
Admission: RE | Admit: 2018-10-03 | Discharge: 2018-10-03 | Disposition: A | Discharge status: Home / Self Care | Payer: Medicaid Other | Source: Ambulatory Visit | Attending: Certified Nurse Midwife | Admitting: Certified Nurse Midwife

## 2018-10-03 ENCOUNTER — Encounter: Payer: Self-pay | Admitting: Certified Nurse Midwife

## 2018-10-03 ENCOUNTER — Ambulatory Visit (INDEPENDENT_AMBULATORY_CARE_PROVIDER_SITE_OTHER): Payer: Medicaid Other | Admitting: Certified Nurse Midwife

## 2018-10-03 VITALS — BP 124/86 | HR 64 | Ht 61.0 in | Wt 115.5 lb

## 2018-10-03 DIAGNOSIS — N941 Unspecified dyspareunia: Secondary | ICD-10-CM

## 2018-10-03 DIAGNOSIS — N898 Other specified noninflammatory disorders of vagina: Secondary | ICD-10-CM | POA: Diagnosis present

## 2018-10-03 DIAGNOSIS — D259 Leiomyoma of uterus, unspecified: Secondary | ICD-10-CM | POA: Diagnosis not present

## 2018-10-03 DIAGNOSIS — R102 Pelvic and perineal pain: Secondary | ICD-10-CM | POA: Diagnosis present

## 2018-10-03 NOTE — Patient Instructions (Addendum)
How to Take a Sitz Bath A sitz bath is a warm water bath that is taken while you are sitting down. The water should only come up to your hips and should cover your buttocks. Your health care provider may recommend a sitz bath to help you:  Clean the lower part of your body, including your genital area.  With itching.  With pain.  With sore muscles or muscles that tighten or spasm.  How to take a sitz bath Take 3-4 sitz baths per day or as told by your health care provider. 1. Partially fill a bathtub with warm water. You will only need the water to be deep enough to cover your hips and buttocks when you are sitting in it. 2. If your health care provider told you to put medicine in the water, follow the directions exactly. 3. Sit in the water and open the tub drain a little. 4. Turn on the warm water again to keep the tub at the correct level. Keep the water running constantly. 5. Soak in the water for 15-20 minutes or as told by your health care provider. 6. After the sitz bath, pat the affected area dry first. Do not rub it. 7. Be careful when you stand up after the sitz bath because you may feel dizzy.  Contact a health care provider if:  Your symptoms get worse. Do not continue with sitz baths if your symptoms get worse.  You have new symptoms. Do not continue with sitz baths until you talk with your health care provider. This information is not intended to replace advice given to you by your health care provider. Make sure you discuss any questions you have with your health care provider. Document Released: 06/27/2004 Document Revised: 03/04/2016 Document Reviewed: 10/03/2014 Elsevier Interactive Patient Education  2018 [employer]. Vaginitis Vaginitis is an inflammation of the vagina. It can happen when the normal bacteria and yeast in the vagina grow too much. There are different types. Treatment will depend on the type you have. Follow these instructions at home:  Take all  medicines as told by your doctor.  Keep your vagina area clean and dry. Avoid soap. Rinse the area with water.  Avoid washing and cleaning out the vagina (douching).  Do not use tampons or have sex (intercourse) until your treatment is done.  Wipe from front to back after going to the restroom.  Wear cotton underwear.  Avoid wearing underwear while you sleep until your vaginitis is gone.  Avoid tight pants. Avoid underwear or nylons without a cotton panel.  Take off wet clothing (such as a bathing suit) as soon as you can.  Use mild, unscented products. Avoid fabric softeners and scented: ? Feminine sprays. ? Laundry detergents. ? Tampons. ? Soaps or bubble baths.  Practice safe sex and use condoms. Get help right away if:  You have belly (abdominal) pain.  You have a fever or lasting symptoms for more than 2-3 days.  You have a fever and your symptoms suddenly get worse. This information is not intended to replace advice given to you by your health care provider. Make sure you discuss any questions you have with your health care provider. Document Released: 01/01/2009 Document Revised: 03/12/2016 Document Reviewed: 03/17/2012 Elsevier Interactive Patient Education  2017 [employer] Uterine Fibroids Uterine fibroids are tissue masses (tumors) that can develop in the womb (uterus). They are also called leiomyomas. This type of tumor is not cancerous (benign) and does not spread to other parts  of the body outside of the pelvic area, which is between the hip bones. Occasionally, fibroids may develop in the fallopian tubes, in the cervix, or on the support structures (ligaments) that surround the uterus. You can have one or many fibroids. Fibroids can vary in size, weight, and where they grow in the uterus. Some can become quite large. Most fibroids do not require medical treatment. What are the causes? A fibroid can develop when a single uterine cell keeps growing  (replicating). Most cells in the human body have a control mechanism that keeps them from replicating without control. What are the signs or symptoms? Symptoms may include:  Heavy bleeding during your period.  Bleeding or spotting between periods.  Pelvic pain and pressure.  Bladder problems, such as needing to urinate more often (urinary frequency) or urgently.  Inability to reproduce offspring (infertility).  Miscarriages.  How is this diagnosed? Uterine fibroids are diagnosed through a physical exam. Your health care provider may feel the lumpy tumors during a pelvic exam. Ultrasonography and an MRI may be done to determine the size, location, and number of fibroids. How is this treated? Treatment may include:  Watchful waiting. This involves getting the fibroid checked by your health care provider to see if it grows or shrinks. Follow your health care provider's recommendations for how often to have this checked.  Hormone medicines. These can be taken by mouth or given through an intrauterine device (IUD).  Surgery. ? Removing the fibroids (myomectomy) or the uterus (hysterectomy). ? Removing blood supply to the fibroids (uterine artery embolization).  If fibroids interfere with your fertility and you want to become pregnant, your health care provider may recommend having the fibroids removed. Follow these instructions at home:  Keep all follow-up visits as directed by your health care provider. This is important.  Take over-the-counter and prescription medicines only as told by your health care provider. ? If you were prescribed a hormone treatment, take the hormone medicines exactly as directed.  Ask your health care provider about taking iron pills and increasing the amount of dark green, leafy vegetables in your diet. These actions can help to boost your blood iron levels, which may be affected by heavy menstrual bleeding.  Pay close attention to your period and tell  your health care provider about any changes, such as: ? Increased blood flow that requires you to use more pads or tampons than usual per month. ? A change in the number of days that your period lasts per month. ? A change in symptoms that are associated with your period, such as abdominal cramping or back pain. Contact a health care provider if:  You have pelvic pain, back pain, or abdominal cramps that cannot be controlled with medicines.  You have an increase in bleeding between and during periods.  You soak tampons or pads in a half hour or less.  You feel lightheaded, extra tired, or weak. Get help right away if:  You faint.  You have a sudden increase in pelvic pain. This information is not intended to replace advice given to you by your health care provider. Make sure you discuss any questions you have with your health care provider. Document Released: 10/02/2000 Document Revised: 06/04/2016 Document Reviewed: 04/03/2014 Elsevier Interactive Patient Education  2018 [employer]. Vaginitis Vaginitis is an inflammation of the vagina. It can happen when the normal bacteria and yeast in the vagina grow too much. There are different types. Treatment will depend on the type you have.  Follow these instructions at home:  Take all medicines as told by your doctor.  Keep your vagina area clean and dry. Avoid soap. Rinse the area with water.  Avoid washing and cleaning out the vagina (douching).  Do not use tampons or have sex (intercourse) until your treatment is done.  Wipe from front to back after going to the restroom.  Wear cotton underwear.  Avoid wearing underwear while you sleep until your vaginitis is gone.  Avoid tight pants. Avoid underwear or nylons without a cotton panel.  Take off wet clothing (such as a bathing suit) as soon as you can.  Use mild, unscented products. Avoid fabric softeners and scented: ? Feminine sprays. ? Laundry  detergents. ? Tampons. ? Soaps or bubble baths.  Practice safe sex and use condoms. Get help right away if:  You have belly (abdominal) pain.  You have a fever or lasting symptoms for more than 2-3 days.  You have a fever and your symptoms suddenly get worse. This information is not intended to replace advice given to you by your health care provider. Make sure you discuss any questions you have with your health care provider. Document Released: 01/01/2009 Document Revised: 03/12/2016 Document Reviewed: 03/17/2012 Elsevier Interactive Patient Education  2017 [employer].

## 2018-10-03 NOTE — Progress Notes (Signed)
Patient here to discuss surgery for ovarian cyst, having constant lower left pelvic pain that started a while ago. Patient c/o having vaginal irritation and d/c x2days, no odor.

## 2018-10-05 LAB — CERVICOVAGINAL ANCILLARY ONLY
Bacterial vaginitis: NEGATIVE
Candida vaginitis: POSITIVE — AB
TRICH (WINDOWPATH): NEGATIVE

## 2018-10-06 ENCOUNTER — Telehealth: Payer: Self-pay

## 2018-10-06 DIAGNOSIS — D259 Leiomyoma of uterus, unspecified: Secondary | ICD-10-CM | POA: Insufficient documentation

## 2018-10-06 DIAGNOSIS — N898 Other specified noninflammatory disorders of vagina: Secondary | ICD-10-CM | POA: Insufficient documentation

## 2018-10-06 DIAGNOSIS — R102 Pelvic and perineal pain: Secondary | ICD-10-CM | POA: Insufficient documentation

## 2018-10-06 NOTE — Progress Notes (Signed)
Please contact patient. Vaginal swab positive for yeast. My have oral Diflucan 150 x 2 doses or 7 day vaginal terazol for treatment. Thanks, JML

## 2018-10-06 NOTE — Progress Notes (Signed)
GYN ENCOUNTER NOTE  Subjective:       Amy Waller is a 57 y.o. [id number] female is here for gynecologic evaluation of the following issues:  1. Vaginal irritation and discharge 2. Pelvic pain 3. Dyspareunia 4. Question management options for uterine fibroids  Patient presents with symptoms after using mother's [place] to shower. Also, questions management options for small uterine fibroid which she believed is her source of pelvic pain and dyspareunia.   Denies difficulty breathing or respiratory distress, chest pain, vaginal bleeding, dysuria, and leg pain or swelling.   Gynecologic History  Patient's last menstrual period was 10/18/2015.  Contraception: condoms and post menopausal status  Last Pap: 2017. Results were: normal  Last mammogram: 2013. Results were: normal  Obstetric History OB History  Gravida Para Term Preterm AB Living  4 2     2     SAB TAB Ectopic Multiple Live Births  1            # Outcome Date GA Lbr Len/2nd Weight Sex Delivery Anes PTL Lv  4 AB           3 SAB           2 Para           1 Para             Obstetric Comments  1st Menstrual Cycle:  14   1st Pregnancy: 25     Past Medical History:  Diagnosis Date  . Arthritis   . Dog bite 11-24-15   right thigh  . Hypertension   . Vaginal discharge     Past Surgical History:  Procedure Laterality Date  . [place]    Current Outpatient Medications on File Prior to Visit  Medication Sig Dispense Refill  . atenolol (TENORMIN) 50 MG tablet Take 50 mg by mouth daily. for high blood pressure  5  . lisinopril (PRINIVIL,ZESTRIL) 10 MG tablet Take 10 mg by mouth daily.     No current facility-administered medications on file prior to visit.     No Known Allergies  Social History   Socioeconomic History  . Marital status: Single    Spouse name: Not on file  . Number of children: Not on file  . Years of education: Not on file  . Highest education level: Not  on file  Occupational History  . Not on file  Social Needs  . Financial resource strain: Not on file  . Food insecurity:    Worry: Not on file    Inability: Not on file  . Transportation needs:    Medical: Not on file    Non-medical: Not on file  Tobacco Use  . Smoking status: Current Every Day Smoker    Packs/day: 0.25    Years: 10.00    Pack years: 2.50    Types: Cigarettes  . Smokeless tobacco: Never Used  Substance and Sexual Activity  . Alcohol use: Yes    Comment: occasionally  . Drug use: Not Currently  . Sexual activity: Yes    Birth control/protection: None  Lifestyle  . Physical activity:    Days per week: Not on file    Minutes per session: Not on file  . Stress: Not on file  Relationships  . Social connections:    Talks on phone: Not on file    Gets together: Not on file    Attends religious service: Not on file    Active member  of club or organization: Not on file    Attends meetings of clubs or organizations: Not on file    Relationship status: Not on file  . Intimate partner violence:    Fear of current or ex partner: Not on file    Emotionally abused: Not on file    Physically abused: Not on file    Forced sexual activity: Not on file  Other Topics Concern  . Not on file  Social History Narrative  . Not on file    Family History  Problem Relation Age of Onset  . Cancer Mother 34       ovarian  . Hypertension Mother   . Ovarian cancer Mother   . Cancer Sister 53       throat  . Hypertension Sister   . Cancer Maternal Uncle        throat  . Hypertension Maternal Uncle   . Breast cancer Maternal Aunt   . Colon cancer Neg Hx     The following portions of the patient's history were reviewed and updated as appropriate: allergies, current medications, past family history, past medical history, past social history, past surgical history and problem list.  Review of Systems  ROS negative except as noted above. Information obtained from  patient.   Objective:   BP 124/86   Pulse 64   Ht 5\' 1"  (1.549 m)   Wt 115 lb 8 oz (52.4 kg)   LMP 10/18/2015   BMI 21.82 kg/m    CONSTITUTIONAL: Well-developed, well-nourished female in no acute distress.    ABDOMEN: Soft, non distended; Non tender.  No Organomegaly.  PELVIC:  External Genitalia: Normal  Vagina: White discharge present  Cervix: Normal  Uterus: Normal size, shape,consistency, mobile  Adnexa: Normal   MUSCULOSKELETAL: Normal range of motion. No tenderness.  No cyanosis, clubbing, or edema.  ULTRASOUND REPORT  Location: [facility] Date of Service: 02/12/17   Indications: Pelvic Pain Findings:  The uterus is anteflexed and measures 8.4 x 4.2 x 5.5 cm. Echo texture is diffusely heterogenous. There is definitely one fibroid seen, however, due to the heterogenicity of the uterus, it is difficult to determine if there are other fibroids. There is no defined border, but the echotexture and overall size of the uterus may indicate more fibroids versus other etiology.  Fibroid 1: left fundal, intramural and measures 0.8 x 0.8 x 0.6 cm.  The Endometrium is difficult to visualize, but appears to measure 5.6 mm.  Right Ovary measures 2.7 x 1.9 x 1.6 cm, and appears WNL. Left ovary is not visualized, but a thorough survey of the left adnexa appears WNL. Survey of the adnexa demonstrates no adnexal masses. There is no free fluid in the cul de sac.  Impression: 1. Fibroid uterus with diffuse heterogenicity throughout. 2. Endometrium 5.6 mm 3. Lt ovary not visualized.  Recommendations: 1.Clinical correlation with the patient's History and Physical Exam.    Assessment:   1. Vaginal irritation  - Cervicovaginal ancillary only  2. Dyspareunia in female   3. Uterine leiomyoma, unspecified location   4. Vaginal discharge  - Cervicovaginal ancillary only  5. Pelvic pain  - Cervicovaginal ancillary only  Plan:   Labs: vaginal  swab completed, will contact patient with results.   Discussed fibroid management options including medication, laparoscopy, myomectomy and uterine artery embolization; handouts given.   Patient has not taken blood pressure medication this morning due to waking up late. Encouraged to go home and take medication as  ordered.   Education regarding home vaginal health techniques.  Reviewed red flag symptoms and when to call.   RTC as needed.    Diona Fanti, CNM [facility] 10/06/18 12:34 AM

## 2018-10-06 NOTE — Telephone Encounter (Signed)
Voicemail message left for pt to please return my call re: test results per ML.

## 2018-10-07 ENCOUNTER — Telehealth: Payer: Self-pay

## 2018-10-07 NOTE — Telephone Encounter (Signed)
Attempted to contact pt- unable to leave a message due to mailbox being full.

## 2018-10-10 ENCOUNTER — Telehealth: Payer: Self-pay

## 2018-10-10 NOTE — Telephone Encounter (Signed)
Several attempts made to contact patient. Letter sent.

## 2018-10-10 NOTE — Telephone Encounter (Signed)
-----   Message from Diona Fanti, CNM sent at 10/06/2018 12:23 AM EST ----- Please contact patient. Vaginal swab positive for yeast. My have oral Diflucan 150 x 2 doses or 7 day vaginal terazol for treatment. Thanks, JML

## 2018-10-24 ENCOUNTER — Telehealth: Payer: Self-pay | Admitting: Certified Nurse Midwife

## 2018-10-24 NOTE — Telephone Encounter (Signed)
The patient called and stated that she would like to speak with her nurse or provider in regards to her receiving a letter in the mail for results. Please advise.

## 2018-10-24 NOTE — Telephone Encounter (Signed)
Attempted to contact patient, unable to leave voicemail due to it being full.  Will try again later.

## 2018-10-25 ENCOUNTER — Other Ambulatory Visit: Payer: Self-pay

## 2018-10-25 MED ORDER — FLUCONAZOLE 150 MG PO TABS: 150.0000 mg | ORAL_TABLET | Freq: Once | ORAL | 1 refills | Status: AC

## 2018-10-25 NOTE — Telephone Encounter (Signed)
Patient came into office today for lab result, lab result given.  Patient verbalized understanding and request Diflucan be sent in to pharmacy on file.  Diflucan sent in.  Encouraged to activate MyChart, patient declines due to not having internet access.

## 2018-10-31 NOTE — Telephone Encounter (Signed)
Attempted to contact patient to advise her if she will like to proceed with surgery she needs to set up a consult visit with Dr.Cherry.  Phone line is busy, will try to call back later.

## 2018-11-01 ENCOUNTER — Telehealth: Payer: Self-pay

## 2018-11-01 NOTE — Telephone Encounter (Signed)
Patient came in to office 10/24/2018 wanting vaginal swab results and also asked what was the next step she needed to take to proceed with surgery.  Attempted to contact patient to advise to set up consult appointment with Dr.Cherry, phone line busy.  Will try again later.

## 2018-11-02 ENCOUNTER — Telehealth: Payer: Self-pay

## 2018-11-02 NOTE — Telephone Encounter (Signed)
Attempted to contact patient 3 times. See previous telephone notes.  Chart will be filed.

## 2018-11-11 ENCOUNTER — Emergency Department
Admission: EM | Admit: 2018-11-11 | Discharge: 2018-11-11 | Disposition: A | Discharge status: Home / Self Care | Payer: Medicaid Other | Attending: Emergency Medicine | Admitting: Emergency Medicine

## 2018-11-11 ENCOUNTER — Emergency Department: Payer: Medicaid Other

## 2018-11-11 DIAGNOSIS — I10 Essential (primary) hypertension: Secondary | ICD-10-CM | POA: Insufficient documentation

## 2018-11-11 DIAGNOSIS — Z79899 Other long term (current) drug therapy: Secondary | ICD-10-CM | POA: Insufficient documentation

## 2018-11-11 DIAGNOSIS — M7712 Lateral epicondylitis, left elbow: Secondary | ICD-10-CM | POA: Insufficient documentation

## 2018-11-11 DIAGNOSIS — F17210 Nicotine dependence, cigarettes, uncomplicated: Secondary | ICD-10-CM | POA: Insufficient documentation

## 2018-11-11 DIAGNOSIS — M79602 Pain in left arm: Secondary | ICD-10-CM | POA: Diagnosis present

## 2018-11-11 MED ORDER — BUPIVACAINE HCL (PF) 0.5 % IJ SOLN
30.0000 mL | Freq: Once | INTRAMUSCULAR | Status: AC
  Administered 2018-11-11: 30 mL
  Filled 2018-11-11: qty 30

## 2018-11-11 MED ORDER — IBUPROFEN 600 MG PO TABS: 600.0000 mg | ORAL_TABLET | Freq: Three times a day (TID) | ORAL | 0 refills | Status: DC

## 2018-11-11 MED ORDER — KETOROLAC TROMETHAMINE 30 MG/ML IJ SOLN
30.0000 mg | Freq: Once | INTRAMUSCULAR | Status: AC
  Administered 2018-11-11: 30 mg via INTRAMUSCULAR
  Filled 2018-11-11: qty 1

## 2018-11-11 NOTE — Discharge Instructions (Addendum)
Please take your pain medication three times a day no matter if you have pain or not for the next two weeks to help calm down the inflammation.  It's critically important that you make an appointment to follow up with the Orthopedic Surgeon within 1 week for a recheck.  Sometimes these benefit from different kinds of injections or from physical therapy.  Please purchase an over the counter forearm strap and wear it around the clock to help with your pain.  Return to the ED sooner for any concerns.  It was a pleasure to take care of you today, and thank you for coming to our emergency department.  If you have any questions or concerns before leaving please ask the nurse to grab me and I'm more than happy to go through your aftercare instructions again.  If you have any concerns once you are home that you are not improving or are in fact getting worse before you can make it to your follow-up appointment, please do not hesitate to call 911 and come back for further evaluation.  Darel Hong, MD

## 2018-11-11 NOTE — ED Triage Notes (Addendum)
Patient reports she hit her left elbow on metal stove months ago on a metal stove at work several months ago. Patient reports ongoing pain since injury, with increased severity today.

## 2018-11-11 NOTE — ED Notes (Signed)
ED Provider at bedside. 

## 2018-11-11 NOTE — ED Provider Notes (Signed)
[facility] Emergency Department Provider Note  ____________________________________________   First MD Initiated Contact with Patient 11/11/18 [phone]     (approximate)  I have reviewed the triage vital signs and the nursing notes.   HISTORY  Chief Complaint Arm Pain   HPI Amy Waller is a 58 y.o. female who self presents to the emergency department with pain to her left lateral elbow.  She is right-hand dominant.  She said she hit her elbow several months ago and has had intermittent aching discomfort ever since.  It will "calm down" and then flared back up.  This evening it became acutely worse which prompted the visit.  Pain is worse when trying to lift anything heavy.  No numbness or weakness.  No new trauma.    Past Medical History:  Diagnosis Date  . Arthritis   . Dog bite 11-24-15   right thigh  . Hypertension   . Vaginal discharge     Patient Active Problem List   Diagnosis Date Noted  . Pelvic pain 10/06/2018  . Vaginal irritation 10/06/2018  . Uterine leiomyoma 10/06/2018  . Left arm weakness 02/07/2015  . Numbness and tingling in left arm 02/07/2015  . Neck pain on left side 12/19/2014  . Numbness of arm 12/19/2014    Past Surgical History:  Procedure Laterality Date  . [place]    Prior to Admission medications   Medication Sig Start Date End Date Taking? Authorizing Provider  atenolol (TENORMIN) 50 MG tablet Take 50 mg by mouth daily. for high blood pressure 05/05/18   [provider]  ibuprofen (ADVIL,MOTRIN) 600 MG tablet Take 1 tablet (600 mg total) by mouth 3 (three) times daily. 11/11/18   Darel Hong, MD  lisinopril (PRINIVIL,ZESTRIL) 10 MG tablet Take 10 mg by mouth daily.    [provider]    Allergies Patient has no known allergies.  Family History  Problem Relation Age of Onset  . Cancer Mother 62       ovarian  . Hypertension Mother   . Ovarian cancer Mother   .  Cancer Sister 53       throat  . Hypertension Sister   . Cancer Maternal Uncle        throat  . Hypertension Maternal Uncle   . Breast cancer Maternal Aunt   . Colon cancer Neg Hx     Social History Social History   Tobacco Use  . Smoking status: Current Every Day Smoker    Packs/day: 0.25    Years: 10.00    Pack years: 2.50    Types: Cigarettes  . Smokeless tobacco: Never Used  Substance Use Topics  . Alcohol use: Yes    Comment: occasionally  . Drug use: Not Currently    Review of Systems Constitutional: No fever/chills Cardiovascular: Denies chest pain. Respiratory: Denies shortness of breath. Musculoskeletal: Positive for elbow pain Skin: Negative for rash. Neurological: Negative for headaches, focal weakness or numbness.   ____________________________________________   PHYSICAL EXAM:  VITAL SIGNS: ED Triage Vitals  Enc Vitals Group     BP      Pulse      Resp      Temp      Temp src      SpO2      Weight      Height      Head Circumference      Peak Flow      Pain Score  Pain Loc      Pain Edu?      Excl. in [place]?     Constitutional: Alert and oriented x4 appears obviously uncomfortable though nontoxic no diaphoresis and speaks in full clear sentences Cardiovascular: Normal rate, regular rhythm. Grossly normal heart sounds.  Good peripheral circulation. Respiratory: Normal respiratory effort.  No retractions. Lungs CTAB and moving good air Musculoskeletal: Quizzically tender over lateral epicondyle on the left.  No overlying skin changes.  Significant discomfort with forced flexion and forced pronation Neurologic:  Normal speech and language. No gross focal neurologic deficits are appreciated. Skin:  Skin is warm, dry and intact. No rash noted. Psychiatric: Mood and affect are normal. Speech and behavior are normal.    ____________________________________________   DIFFERENTIAL includes but not limited to  Lateral epicondylitis,  fracture, bursitis ____________________________________________   LABS (all labs ordered are listed, but only abnormal results are displayed)  Labs Reviewed - No data to display   __________________________________________  EKG   ____________________________________________  RADIOLOGY  X-ray of the left elbow reviewed by me with no acute disease ____________________________________________   PROCEDURES  Procedure(s) performed: Yes  .Nerve Block Date/Time: 11/11/2018 6:00 AM Performed by: Darel Hong, MD Authorized by: Darel Hong, MD   Consent:    Consent obtained:  Verbal   Consent given by:  Patient   Risks discussed:  Allergic reaction, infection, intravenous injection, pain and unsuccessful block   Alternatives discussed:  Alternative treatment Indications:    Indications:  Pain relief Location:    Body area:  Upper extremity   Upper extremity nerve blocked: Lateral epicondyle.   Laterality:  Left Pre-procedure details:    Skin preparation:  2% chlorhexidine Skin anesthesia (see MAR for exact dosages):    Skin anesthesia method:  None Procedure details (see MAR for exact dosages):    Block needle gauge:  25 G   Anesthetic injected:  Bupivacaine 0.5% w/o epi   Steroid injected:  None   Additive injected:  None   Injection procedure:  Anatomic landmarks identified, incremental injection, negative aspiration for blood and anatomic landmarks palpated Post-procedure details:    Dressing:  None   Outcome:  Anesthesia achieved   Patient tolerance of procedure:  Tolerated well, no immediate complications    Critical Care performed: no  ____________________________________________   INITIAL IMPRESSION / ASSESSMENT AND PLAN / ED COURSE  Pertinent labs & imaging results that were available during my care of the patient were reviewed by me and considered in my medical decision making (see chart for details).   As part of my medical decision making, I  reviewed the following data within the [place] History obtained from family if available, nursing notes, old chart and ekg, as well as notes from prior ED visits.  The patient comes to the emergency department with clinically lateral epicondylitis that has not resolved and has acutely exacerbated.  X-ray is fortunately negative and given intramuscular Toradol with some improvement in her symptoms.  I consented her for bupivacaine injection and her pain got down to a 0.  We discussed a tension band and I will refer her to orthopedic surgery as an outpatient.  Strict return precautions have been given.      ____________________________________________   FINAL CLINICAL IMPRESSION(S) / ED DIAGNOSES  Final diagnoses:  Lateral epicondylitis of left elbow      NEW MEDICATIONS STARTED DURING THIS VISIT:  Discharge Medication List as of 11/11/2018  5:26 AM    START taking  these medications   Details  ibuprofen (ADVIL,MOTRIN) 600 MG tablet Take 1 tablet (600 mg total) by mouth 3 (three) times daily., Starting Fri 11/11/2018, Print         Note:  This document was prepared using Dragon voice recognition software and may include unintentional dictation errors.    Darel Hong, MD 11/13/18 [phone]

## 2018-11-25 ENCOUNTER — Encounter: Payer: Medicaid Other | Admitting: Certified Nurse Midwife

## 2018-11-28 ENCOUNTER — Encounter: Payer: Medicaid Other | Admitting: Certified Nurse Midwife

## 2019-02-08 ENCOUNTER — Encounter: Payer: Self-pay | Admitting: Emergency Medicine

## 2019-02-08 ENCOUNTER — Emergency Department
Admission: EM | Admit: 2019-02-08 | Discharge: 2019-02-08 | Disposition: A | Discharge status: Home / Self Care | Payer: Medicaid Other | Attending: Emergency Medicine | Admitting: Emergency Medicine

## 2019-02-08 ENCOUNTER — Emergency Department: Payer: Medicaid Other

## 2019-02-08 ENCOUNTER — Other Ambulatory Visit: Payer: Self-pay

## 2019-02-08 DIAGNOSIS — R51 Headache: Secondary | ICD-10-CM | POA: Diagnosis not present

## 2019-02-08 DIAGNOSIS — B379 Candidiasis, unspecified: Secondary | ICD-10-CM

## 2019-02-08 DIAGNOSIS — Z79899 Other long term (current) drug therapy: Secondary | ICD-10-CM | POA: Insufficient documentation

## 2019-02-08 DIAGNOSIS — Y9389 Activity, other specified: Secondary | ICD-10-CM | POA: Diagnosis not present

## 2019-02-08 DIAGNOSIS — S0083XA Contusion of other part of head, initial encounter: Secondary | ICD-10-CM | POA: Diagnosis not present

## 2019-02-08 DIAGNOSIS — Y998 Other external cause status: Secondary | ICD-10-CM | POA: Insufficient documentation

## 2019-02-08 DIAGNOSIS — F17210 Nicotine dependence, cigarettes, uncomplicated: Secondary | ICD-10-CM | POA: Insufficient documentation

## 2019-02-08 DIAGNOSIS — R519 Headache, unspecified: Secondary | ICD-10-CM

## 2019-02-08 DIAGNOSIS — B373 Candidiasis of vulva and vagina: Secondary | ICD-10-CM | POA: Diagnosis not present

## 2019-02-08 DIAGNOSIS — I10 Essential (primary) hypertension: Secondary | ICD-10-CM | POA: Diagnosis not present

## 2019-02-08 DIAGNOSIS — Y92009 Unspecified place in unspecified non-institutional (private) residence as the place of occurrence of the external cause: Secondary | ICD-10-CM | POA: Diagnosis not present

## 2019-02-08 DIAGNOSIS — W19XXXA Unspecified fall, initial encounter: Secondary | ICD-10-CM | POA: Insufficient documentation

## 2019-02-08 DIAGNOSIS — S0990XA Unspecified injury of head, initial encounter: Secondary | ICD-10-CM | POA: Diagnosis present

## 2019-02-08 MED ORDER — FLUCONAZOLE 50 MG PO TABS
150.0000 mg | ORAL_TABLET | Freq: Once | ORAL | Status: AC
  Administered 2019-02-08: 150 mg via ORAL
  Filled 2019-02-08: qty 1

## 2019-02-08 MED ORDER — ATENOLOL 50 MG PO TABS: 50.0000 mg | ORAL_TABLET | Freq: Every day | ORAL | 5 refills | Status: DC

## 2019-02-08 MED ORDER — FLUCONAZOLE 150 MG PO TABS: ORAL_TABLET | ORAL | 0 refills | Status: DC

## 2019-02-08 NOTE — ED Provider Notes (Signed)
[facility] Emergency Department Provider Note  ____________________________________________   First MD Initiated Contact with Patient 02/08/19 [phone]     (approximate)  I have reviewed the triage vital signs and the nursing notes.   HISTORY  Chief Complaint Loss of Consciousness    HPI Amy Waller is a 58 y.o. female with medical history as listed below who presents for evaluation of persistent headache on the right side after a fall 2 nights ago.  She says that she was cooking an extensive meal and had been spending a lot of time in the kitchen in the setting of not taking her blood pressure medicine.  She started feel little bit hot and lightheaded and the symptoms got increasingly bad until finally she passed out.  She struck the right side of her head on the floor.  She returned to consciousness immediately and reportedly had no seizures.  She was able to cool down after laying down outside for little while.  Her activities been normal over the last 2 days since the incident, but she has had a persistent pain and a knot on the top of her head on the right side.  She drove a friend to work early this morning and she was started to feel little bit dizzy again and felt like her blood pressure may be high (she has not taken her blood pressure medicine this morning) so she thought she should get checked out.  She denies weakness in any of her extremities.  She denies neck pain, chest pain, abdominal pain.  She has had no recent illnesses including fever, sore throat, nasal congestion, cough, and dysuria.  However she does say that she was recently treated for a urinary tract infection and has been having some itching of her genital region with some thick discharge consistent with multiple prior yeast infections.  She describes her headache is mild and her yeast infection symptoms as severe and nothing in particular makes either 1 feel better or worse.  She is starting  to run out of her atenolol and is having trouble getting an appoint with her primary care doctor.  She has not been around anyone who is tested positive for COVID-19 and is low risk.         Past Medical History:  Diagnosis Date   Arthritis    Dog bite 11-24-15   right thigh   Hypertension    Vaginal discharge     Patient Active Problem List   Diagnosis Date Noted   Pelvic pain 10/06/2018   Vaginal irritation 10/06/2018   Uterine leiomyoma 10/06/2018   Left arm weakness 02/07/2015   Numbness and tingling in left arm 02/07/2015   Neck pain on left side 12/19/2014   Numbness of arm 12/19/2014    Past Surgical History:  Procedure Laterality Date   [place]    Prior to Admission medications   Medication Sig Start Date End Date Taking? Authorizing Provider  atenolol (TENORMIN) 50 MG tablet Take 1 tablet (50 mg total) by mouth daily. 02/08/19   Hinda Kehr, MD  fluconazole (DIFLUCAN) 150 MG tablet Take 1 tablet (150 mg total) by mouth once for 1 dose if you are still having symptoms in 48 hours. 02/08/19   Hinda Kehr, MD  ibuprofen (ADVIL,MOTRIN) 600 MG tablet Take 1 tablet (600 mg total) by mouth 3 (three) times daily. 11/11/18   Darel Hong, MD  lisinopril (PRINIVIL,ZESTRIL) 10 MG tablet Take 10 mg by mouth  daily.    [provider]    Allergies Patient has no known allergies.  Family History  Problem Relation Age of Onset   Cancer Mother 63       ovarian   Hypertension Mother    Ovarian cancer Mother    Cancer Sister 69       throat   Hypertension Sister    Cancer Maternal Uncle        throat   Hypertension Maternal Uncle    Breast cancer Maternal Aunt    Colon cancer Neg Hx     Social History Social History   Tobacco Use   Smoking status: Current Every Day Smoker    Packs/day: 0.25    Years: 10.00    Pack years: 2.50    Types: Cigarettes   Smokeless tobacco: Never Used  Substance Use Topics    Alcohol use: Yes    Comment: occasionally   Drug use: Not Currently    Review of Systems Constitutional: No fever/chills Eyes: No visual changes. ENT: No sore throat. Cardiovascular: Denies chest pain.  Syncopal episode 2 nights ago. Respiratory: Denies shortness of breath. Gastrointestinal: No abdominal pain.  No nausea, no vomiting.  No diarrhea.  No constipation. Genitourinary: Negative for dysuria.  Thick whitish discharge with itching similar to prior yeast infections. Musculoskeletal: Persistent pain in the right side of her head with little bit of swelling.  Negative for neck pain.  Negative for back pain. Integumentary: Negative for rash. Neurological: Negative for headaches, focal weakness or numbness.   ____________________________________________   PHYSICAL EXAM:  VITAL SIGNS: ED Triage Vitals  Enc Vitals Group     BP 02/08/19 0551 (!) 165/107     Pulse Rate 02/08/19 0551 60     Resp 02/08/19 0551 17     Temp 02/08/19 0551 97.8 F (36.6 C)     Temp Source 02/08/19 0551 Oral     SpO2 02/08/19 0551 99 %     Weight 02/08/19 0542 52.2 kg (115 lb)     Height 02/08/19 0542 1.549 m (5\' 1" )     Head Circumference --      Peak Flow --      Pain Score 02/08/19 0541 8     Pain Loc --      Pain Edu? --      Excl. in [place]? --     Constitutional: Alert and oriented. Well appearing and in no acute distress. Eyes: Conjunctivae are normal.  Pupils are equal and reactive. Head: Patient has a small hematoma on the right side of the top of her head but without any laceration. Nose: No congestion/rhinnorhea. Mouth/Throat: Mucous membranes are moist. Neck: No stridor.  No meningeal signs.  No cervical spine tenderness to palpation. Cardiovascular: Normal rate, regular rhythm. Good peripheral circulation. Grossly normal heart sounds. Respiratory: Normal respiratory effort.  No retractions. No audible wheezing. Gastrointestinal: Soft and nontender. No distention.    Musculoskeletal: No lower extremity tenderness nor edema. No gross deformities of extremities. Neurologic:  Normal speech and language. No gross focal neurologic deficits are appreciated.  Skin:  Skin is warm, dry and intact. No rash noted. Psychiatric: Mood and affect are normal. Speech and behavior are normal.  ____________________________________________   LABS (all labs ordered are listed, but only abnormal results are displayed)  Labs Reviewed - No data to display ____________________________________________  EKG  ED ECG REPORT I, Hinda Kehr, the attending physician, personally viewed and interpreted this ECG.  Date: 02/08/2019 EKG  Time: 5:52 AM Rate: 57 Rhythm: normal sinus rhythm QRS Axis: normal Intervals: normal ST/T Wave abnormalities: Non-specific ST segment / T-wave changes, but no clear evidence of acute ischemia. Narrative Interpretation: no definitive evidence of acute ischemia; does not meet STEMI criteria.   ____________________________________________  RADIOLOGY   ED MD interpretation: No indication of acute abnormality on CT scan of the head  Official radiology report(s): Ct Head Wo Contrast  Result Date: 02/08/2019 CLINICAL DATA:  58 year old female with dizziness and fall striking head. Right side headache. EXAM: CT HEAD WITHOUT CONTRAST TECHNIQUE: Contiguous axial images were obtained from the base of the skull through the vertex without intravenous contrast. COMPARISON:  Cervical spine MRI 08/10/2015. Head CT 07/07/2008. FINDINGS: Brain: Cerebral volume is within normal limits for age. No midline shift, ventriculomegaly, mass effect, evidence of mass lesion, intracranial hemorrhage or evidence of cortically based acute infarction. Mild for age scattered white matter hypodensity, some of which was present in 2009. Vascular: Mild Calcified atherosclerosis at the skull base. No suspicious intracranial vascular hyperdensity. Skull: Calvarium stable and  intact. Chronic left lamina papyracea fracture. Sinuses/Orbits: Bubbly opacity in the right sphenoid sinus. Chronic left lamina papyracea fracture is unchanged since 2009. Other Visualized paranasal sinuses and mastoids are stable and well pneumatized. Other: No acute orbit or scalp soft tissue finding. IMPRESSION: 1. No acute intracranial abnormality or acute traumatic injury identified. 2. Mild for age nonspecific cerebral white matter changes, most commonly due to chronic small vessel disease. Electronically Signed   By: Genevie Ann M.D.   On: 02/08/2019 07:02    ____________________________________________   PROCEDURES   Procedure(s) performed (including Critical Care):  Procedures   ____________________________________________   INITIAL IMPRESSION / MDM / [place] / ED COURSE  As part of my medical decision making, I reviewed the following data within the [place] notes reviewed and incorporated, EKG interpreted , Old chart reviewed and Notes from prior ED visits      Amy Waller was evaluated in Emergency Department on 02/08/2019 for the symptoms described in the history of present illness. She was evaluated in the context of the global COVID-19 pandemic, which necessitated consideration that the patient might be at risk for infection with the SARS-CoV-2 virus that causes COVID-19. Institutional protocols and algorithms that pertain to the evaluation of patients at risk for COVID-19 are in a state of rapid change based on information released by regulatory bodies including the CDC and federal and state organizations. These policies and algorithms were followed during the patient's care in the ED.  Differential diagnosis includes, but is not limited to, contusion with mild hematoma, vasovagal syncope, electrolyte or metabolic abnormality, acute infection, cardiogenic syncope, UTI, STD/PID.  The patient has been finding conducting her usual  activities for the last 2 days with only a mild persistent headache which was her main concern tonight.  Given her fall and her persistent headache I will check a CT scan of her head to make sure she does not have any sign of a subdural hematoma, but I think it is very unlikely.  Anticipate she will be discharged once the results of the CT are negative.  At this point, 2 days out from the incident, I do not believe that there is any role for lab work and she agrees.  She is intermittently compliant with her blood pressure medicine I counseled her about the importance of taking her medication regularly.  I will refill her prescription since she is  having trouble getting into see her primary care provider but encouraged her to follow-up at the next fillable opportunity.  I also offered a pelvic exam but she has had multiple in the past and she said that she has been in a monogamous relationship for 7 years.  Given her yeast infections in the past, I think it is appropriate to treat her empirically with Diflucan 150 mg by mouth.  I will also give her a prescription for another tablet if she is still having symptoms in 48 hours.  She understands and agrees with the plan and I gave my usual customary return precautions.      ____________________________________________  FINAL CLINICAL IMPRESSION(S) / ED DIAGNOSES  Final diagnoses:  Contusion of other part of head, initial encounter  Mild headache  Essential hypertension  Yeast infection     MEDICATIONS GIVEN DURING THIS VISIT:  Medications  fluconazole (DIFLUCAN) tablet 150 mg (150 mg Oral Given 02/08/19 0630)     ED Discharge Orders         Ordered    atenolol (TENORMIN) 50 MG tablet  Daily     02/08/19 0636    fluconazole (DIFLUCAN) 150 MG tablet     02/08/19 0636           Note:  This document was prepared using Dragon voice recognition software and may include unintentional dictation errors.   Hinda Kehr, MD 02/08/19 [phone]

## 2019-02-08 NOTE — Discharge Instructions (Signed)
Your workup in the Emergency Department today was reassuring.  We did not find any specific abnormalities.  We recommend you drink plenty of fluids, take your regular medications and/or any new ones prescribed today, and follow up with the doctor(s) listed in these documents as recommended.  Return to the Emergency Department if you develop new or worsening symptoms that concern you.  

## 2019-02-08 NOTE — ED Notes (Signed)
Pt also complaining of vaginal itching and states that she believes that she has a yeast infection since it feels like the ones that she had in the past.

## 2019-02-08 NOTE — ED Triage Notes (Signed)
Patient ambulatory to triage with steady gait, without difficulty or distress noted; pt reports onset dizziness then passed out last night hitting head; c/o right sided HA since

## 2019-07-08 ENCOUNTER — Encounter: Payer: Self-pay | Admitting: Physician Assistant

## 2019-07-08 ENCOUNTER — Other Ambulatory Visit: Payer: Self-pay

## 2019-07-08 ENCOUNTER — Emergency Department
Admission: EM | Admit: 2019-07-08 | Discharge: 2019-07-08 | Disposition: A | Discharge status: Home / Self Care | Payer: Medicaid Other | Attending: Emergency Medicine | Admitting: Emergency Medicine

## 2019-07-08 DIAGNOSIS — M436 Torticollis: Secondary | ICD-10-CM | POA: Insufficient documentation

## 2019-07-08 DIAGNOSIS — F17210 Nicotine dependence, cigarettes, uncomplicated: Secondary | ICD-10-CM | POA: Insufficient documentation

## 2019-07-08 DIAGNOSIS — I10 Essential (primary) hypertension: Secondary | ICD-10-CM | POA: Diagnosis not present

## 2019-07-08 DIAGNOSIS — M542 Cervicalgia: Secondary | ICD-10-CM | POA: Diagnosis present

## 2019-07-08 MED ORDER — BACLOFEN 10 MG PO TABS: 10.0000 mg | ORAL_TABLET | Freq: Three times a day (TID) | ORAL | 0 refills | Status: DC

## 2019-07-08 MED ORDER — IBUPROFEN 600 MG PO TABS: 600.0000 mg | ORAL_TABLET | Freq: Three times a day (TID) | ORAL | 0 refills | Status: DC

## 2019-07-08 MED ORDER — IBUPROFEN 600 MG PO TABS
600.0000 mg | ORAL_TABLET | Freq: Once | ORAL | Status: AC
  Administered 2019-07-08: 600 mg via ORAL
  Filled 2019-07-08: qty 1

## 2019-07-08 MED ORDER — CYCLOBENZAPRINE HCL 10 MG PO TABS
10.0000 mg | ORAL_TABLET | Freq: Once | ORAL | Status: AC
  Administered 2019-07-08: 10 mg via ORAL
  Filled 2019-07-08: qty 1

## 2019-07-08 NOTE — Discharge Instructions (Addendum)
Apply warm compress to the left shoulder.  Ice to the spiny part of the neck.  Take medication as prescribed.  Return if worsening.  Follow-up with orthopedics if not better in 5 days.

## 2019-07-08 NOTE — ED Triage Notes (Signed)
Patient reports neck pain for approximately a month.  Denies any type of injury.

## 2019-07-08 NOTE — ED Provider Notes (Signed)
[facility] Emergency Department Provider Note  ____________________________________________   First MD Initiated Contact with Patient 07/08/19 1937     (approximate)  I have reviewed the triage vital signs and the nursing notes.   HISTORY  Chief Complaint Neck Pain    HPI Amy Waller is a 58 y.o. female presents emergency department complaining of recurrent neck pain.  She states that she does sleep on the sofa and now she has left-sided neck pain that causes her pain with movement.  She states that it hurts to turn her neck.  No numbness or tingling.  No known injury.    Past Medical History:  Diagnosis Date  . Arthritis   . Dog bite 11-24-15   right thigh  . Hypertension   . Vaginal discharge     Patient Active Problem List   Diagnosis Date Noted  . Pelvic pain 10/06/2018  . Vaginal irritation 10/06/2018  . Uterine leiomyoma 10/06/2018  . Left arm weakness 02/07/2015  . Numbness and tingling in left arm 02/07/2015  . Neck pain on left side 12/19/2014  . Numbness of arm 12/19/2014    Past Surgical History:  Procedure Laterality Date  . [place]    Prior to Admission medications   Medication Sig Start Date End Date Taking? Authorizing Provider  atenolol (TENORMIN) 50 MG tablet Take 1 tablet (50 mg total) by mouth daily. 02/08/19   Hinda Kehr, MD  baclofen (LIORESAL) 10 MG tablet Take 1 tablet (10 mg total) by mouth 3 (three) times daily. 07/08/19 07/07/20  Vasily Fedewa, Linden Dolin, PA-C  fluconazole (DIFLUCAN) 150 MG tablet Take 1 tablet (150 mg total) by mouth once for 1 dose if you are still having symptoms in 48 hours. 02/08/19   Hinda Kehr, MD  ibuprofen (ADVIL) 600 MG tablet Take 1 tablet (600 mg total) by mouth 3 (three) times daily. 07/08/19   Lavinia Mcneely, Linden Dolin, PA-C  lisinopril (PRINIVIL,ZESTRIL) 10 MG tablet Take 10 mg by mouth daily.    [provider]    Allergies Patient has no known allergies.   Family History  Problem Relation Age of Onset  . Cancer Mother 62       ovarian  . Hypertension Mother   . Ovarian cancer Mother   . Cancer Sister 53       throat  . Hypertension Sister   . Cancer Maternal Uncle        throat  . Hypertension Maternal Uncle   . Breast cancer Maternal Aunt   . Colon cancer Neg Hx     Social History Social History   Tobacco Use  . Smoking status: Current Every Day Smoker    Packs/day: 0.25    Years: 10.00    Pack years: 2.50    Types: Cigarettes  . Smokeless tobacco: Never Used  Substance Use Topics  . Alcohol use: Yes    Comment: occasionally  . Drug use: Not Currently    Review of Systems  Constitutional: No fever/chills Eyes: No visual changes. ENT: No sore throat. Respiratory: Denies cough Genitourinary: Negative for dysuria. Musculoskeletal: Negative for back pain.  Positive for neck pain Skin: Negative for rash.    ____________________________________________   PHYSICAL EXAM:  VITAL SIGNS: ED Triage Vitals  Enc Vitals Group     BP 07/08/19 1923 (!) 136/92     Pulse Rate 07/08/19 1923 74     Resp 07/08/19 1923 18     Temp 07/08/19 1923  97.9 F (36.6 C)     Temp Source 07/08/19 1923 Oral     SpO2 07/08/19 1923 100 %     Weight 07/08/19 1924 115 lb (52.2 kg)     Height 07/08/19 1924 5\' 1"  (1.549 m)     Head Circumference --      Peak Flow --      Pain Score 07/08/19 1924 8     Pain Loc --      Pain Edu? --      Excl. in [place]? --     Constitutional: Alert and oriented. Well appearing and in no acute distress. Eyes: Conjunctivae are normal.  Head: Atraumatic. Nose: No congestion/rhinnorhea. Mouth/Throat: Mucous membranes are moist.   Neck:  supple no lymphadenopathy noted Cardiovascular: Normal rate, regular rhythm.  Respiratory: Normal respiratory effort.  No retractions,  GU: deferred Musculoskeletal: FROM all extremities, warm and well perfused, C-spine is nontender, multiple spasms noted in the left  shoulder.  Neurovascular is intact Neurologic:  Normal speech and language.  Skin:  Skin is warm, dry and intact. No rash noted. Psychiatric: Mood and affect are normal. Speech and behavior are normal.  ____________________________________________   LABS (all labs ordered are listed, but only abnormal results are displayed)  Labs Reviewed - No data to display ____________________________________________   ____________________________________________  RADIOLOGY    ____________________________________________   PROCEDURES  Procedure(s) performed: Ibuprofen 800 mg p.o., Flexeril 10 mg p.o.   Procedures    ____________________________________________   INITIAL IMPRESSION / ASSESSMENT AND PLAN / ED COURSE  Pertinent labs & imaging results that were available during my care of the patient were reviewed by me and considered in my medical decision making (see chart for details).   Patient is 58 year old female presents emergency department with neck pain.  See HPI  Physical exam shows patient to appear well.  There is muscle spasms noted in the left shoulder.  Splane the findings to the patient.  She is given ibuprofen and Flexeril here in the ED.  Prescription for ibuprofen and baclofen.  She is to try and sleep in a bed instead of on the sofa.  Take medication as prescribed.  Follow-up with orthopedics if not better in 5 to 7 days or her regular doctor.  She is discharged stable condition.    Amy Waller was evaluated in Emergency Department on 07/08/2019 for the symptoms described in the history of present illness. She was evaluated in the context of the global COVID-19 pandemic, which necessitated consideration that the patient might be at risk for infection with the SARS-CoV-2 virus that causes COVID-19. Institutional protocols and algorithms that pertain to the evaluation of patients at risk for COVID-19 are in a state of rapid change based on information released by  regulatory bodies including the CDC and federal and state organizations. These policies and algorithms were followed during the patient's care in the ED.   As part of my medical decision making, I reviewed the following data within the [place] notes reviewed and incorporated, Old chart reviewed, Notes from prior ED visits and [place] Controlled Substance Database  ____________________________________________   FINAL CLINICAL IMPRESSION(S) / ED DIAGNOSES  Final diagnoses:  Torticollis      NEW MEDICATIONS STARTED DURING THIS VISIT:  New Prescriptions   BACLOFEN (LIORESAL) 10 MG TABLET    Take 1 tablet (10 mg total) by mouth 3 (three) times daily.     Note:  This document was prepared using Dragon voice  recognition software and may include unintentional dictation errors.    Versie Starks, PA-C 07/08/19 2151    Earleen Newport, MD 07/08/19 2249

## 2019-10-17 ENCOUNTER — Ambulatory Visit: Payer: Medicaid Other | Attending: Internal Medicine

## 2019-10-17 DIAGNOSIS — Z20822 Contact with and (suspected) exposure to covid-19: Secondary | ICD-10-CM

## 2019-10-18 LAB — NOVEL CORONAVIRUS, NAA: SARS-CoV-2, NAA: NOT DETECTED

## 2019-12-18 ENCOUNTER — Ambulatory Visit: Payer: Self-pay

## 2019-12-21 ENCOUNTER — Ambulatory Visit: Payer: Medicaid Other

## 2020-02-05 ENCOUNTER — Encounter: Payer: Medicaid Other | Admitting: Certified Nurse Midwife

## 2020-02-21 ENCOUNTER — Encounter: Payer: Self-pay | Admitting: Emergency Medicine

## 2020-02-21 ENCOUNTER — Other Ambulatory Visit: Payer: Self-pay

## 2020-02-21 ENCOUNTER — Emergency Department: Payer: Medicaid Other

## 2020-02-21 DIAGNOSIS — Z79899 Other long term (current) drug therapy: Secondary | ICD-10-CM | POA: Diagnosis not present

## 2020-02-21 DIAGNOSIS — Y999 Unspecified external cause status: Secondary | ICD-10-CM | POA: Insufficient documentation

## 2020-02-21 DIAGNOSIS — Y9389 Activity, other specified: Secondary | ICD-10-CM | POA: Insufficient documentation

## 2020-02-21 DIAGNOSIS — Y929 Unspecified place or not applicable: Secondary | ICD-10-CM | POA: Insufficient documentation

## 2020-02-21 DIAGNOSIS — S46911A Strain of unspecified muscle, fascia and tendon at shoulder and upper arm level, right arm, initial encounter: Secondary | ICD-10-CM | POA: Insufficient documentation

## 2020-02-21 DIAGNOSIS — X500XXA Overexertion from strenuous movement or load, initial encounter: Secondary | ICD-10-CM | POA: Insufficient documentation

## 2020-02-21 DIAGNOSIS — F17210 Nicotine dependence, cigarettes, uncomplicated: Secondary | ICD-10-CM | POA: Insufficient documentation

## 2020-02-21 DIAGNOSIS — S4991XA Unspecified injury of right shoulder and upper arm, initial encounter: Secondary | ICD-10-CM | POA: Diagnosis present

## 2020-02-21 DIAGNOSIS — I10 Essential (primary) hypertension: Secondary | ICD-10-CM | POA: Diagnosis not present
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
## 2020-02-22 ENCOUNTER — Emergency Department
Admission: EM | Admit: 2020-02-22 | Discharge: 2020-02-22 | Disposition: A | Discharge status: Home / Self Care | Payer: Medicaid Other | Attending: Emergency Medicine | Admitting: Emergency Medicine

## 2020-02-22 DIAGNOSIS — S46911A Strain of unspecified muscle, fascia and tendon at shoulder and upper arm level, right arm, initial encounter: Secondary | ICD-10-CM

## 2020-02-22 MED ORDER — DICLOFENAC SODIUM 75 MG PO TBEC: 75.0000 mg | DELAYED_RELEASE_TABLET | Freq: Two times a day (BID) | ORAL | 0 refills | Status: DC

## 2020-02-22 NOTE — Discharge Instructions (Addendum)

## 2020-02-22 NOTE — ED Provider Notes (Signed)
[facility] Emergency Department Provider Note  ____________________________________________   First MD Initiated Contact with Patient 02/22/20 0600     (approximate)  I have reviewed the triage vital signs and the nursing notes.   HISTORY  Chief Complaint Shoulder Injury    HPI Amy Waller is a 59 y.o. female with medical history as listed below which notably includes cervical radiculopathy proven by EMG of her neck and left upper extremity (neurology notes from 2016).  She presents tonight for pain in her right shoulder that she says she sustained from lifting boxes at work.  She said that it is painful if she lifts her arm or lifts anything too heavy.  No numbness nor tingling.  She said that her neck and left arm pain had been better over the last couple of years but that her neck is started bothering a little bit more.  She did not follow-up with Dr. Sharlet Salina as recommended by Dr. Melrose Nakayama in 2016 but she has generally been doing well.  She no longer takes any medications other than her blood pressure medicine including no NSAIDs.  She did not sustain any other injury, has no focal weakness, and nothing in particular other than rest make the right shoulder pain better, and only exertion makes it worse.         Past Medical History:  Diagnosis Date  . Arthritis   . Dog bite 11-24-15   right thigh  . Hypertension   . Vaginal discharge     Patient Active Problem List   Diagnosis Date Noted  . Pelvic pain 10/06/2018  . Vaginal irritation 10/06/2018  . Uterine leiomyoma 10/06/2018  . Left arm weakness 02/07/2015  . Numbness and tingling in left arm 02/07/2015  . Neck pain on left side 12/19/2014  . Numbness of arm 12/19/2014    Past Surgical History:  Procedure Laterality Date  . [place]    Prior to Admission medications   Medication Sig Start Date End Date Taking? Authorizing Provider  atenolol (TENORMIN) 50 MG  tablet Take 1 tablet (50 mg total) by mouth daily. 02/08/19   Hinda Kehr, MD  baclofen (LIORESAL) 10 MG tablet Take 1 tablet (10 mg total) by mouth 3 (three) times daily. 07/08/19 07/07/20  Fisher, Linden Dolin, PA-C  diclofenac (VOLTAREN) 75 MG EC tablet Take 1 tablet (75 mg total) by mouth 2 (two) times daily with a meal. Do not take with any other over-the-counter pain medications except for Tylenol (acetaminophen). 02/22/20   Hinda Kehr, MD  fluconazole (DIFLUCAN) 150 MG tablet Take 1 tablet (150 mg total) by mouth once for 1 dose if you are still having symptoms in 48 hours. 02/08/19   Hinda Kehr, MD  ibuprofen (ADVIL) 600 MG tablet Take 1 tablet (600 mg total) by mouth 3 (three) times daily. 07/08/19   Fisher, Linden Dolin, PA-C  lisinopril (PRINIVIL,ZESTRIL) 10 MG tablet Take 10 mg by mouth daily.    [provider]    Allergies Patient has no known allergies.  Family History  Problem Relation Age of Onset  . Cancer Mother 85       ovarian  . Hypertension Mother   . Ovarian cancer Mother   . Cancer Sister 53       throat  . Hypertension Sister   . Cancer Maternal Uncle        throat  . Hypertension Maternal Uncle   . Breast cancer Maternal Aunt   .  Colon cancer Neg Hx     Social History Social History   Tobacco Use  . Smoking status: Current Every Day Smoker    Packs/day: 0.25    Years: 10.00    Pack years: 2.50    Types: Cigarettes  . Smokeless tobacco: Never Used  Substance Use Topics  . Alcohol use: Yes    Comment: occasionally  . Drug use: Not Currently    Review of Systems Constitutional: No fever/chills Respiratory: Denies shortness of breath. Gastrointestinal: No abdominal pain.   Musculoskeletal: Acute right shoulder pain starting yesterday. Integumentary: Negative for rash. Neurological: Negative for headaches, focal weakness or numbness.   ____________________________________________   PHYSICAL EXAM:  VITAL SIGNS: ED Triage Vitals [02/21/20  2315]  Enc Vitals Group     BP 104/69     Pulse Rate 71     Resp 16     Temp 97.8 F (36.6 C)     Temp Source Oral     SpO2 97 %     Weight 52.2 kg (115 lb)     Height 1.549 m (5\' 1" )     Head Circumference      Peak Flow      Pain Score 8     Pain Loc      Pain Edu?      Excl. in [place]?     Constitutional: Alert and oriented.  Eyes: Conjunctivae are normal.  Head: Atraumatic. Cardiovascular: Good peripheral circulation. Respiratory: Normal respiratory effort.  No retractions. Musculoskeletal: Normal range of motion of right shoulder.  Some pain at extreme of abduction.  No range of motion limitation.  Some tenderness to palpation all along the soft tissues of the left shoulder including the deltoid.  No evidence of an acute or emergent medical condition based on physical exam. Neurologic:  Normal speech and language. No gross focal neurologic deficits are appreciated.  Skin:  Skin is warm, dry and intact. Psychiatric: Mood and affect are normal. Speech and behavior are normal.  ____________________________________________   LABS (all labs ordered are listed, but only abnormal results are displayed)  Labs Reviewed - No data to display ____________________________________________  EKG  No indication for emergent EKG ____________________________________________  RADIOLOGY I, Hinda Kehr, personally viewed and evaluated these images (plain radiographs) as part of my medical decision making, as well as reviewing the written report by the radiologist.  ED MD interpretation: No acute abnormalities identified on shoulder radiographs.  Official radiology report(s): DG Shoulder Right  Result Date: 02/21/2020 CLINICAL DATA:  Right shoulder injury EXAM: RIGHT SHOULDER - 2+ VIEW COMPARISON:  None. FINDINGS: No acute displaced fracture or malalignment. Os acromiale. Right apex is clear. IMPRESSION: No acute osseous abnormality Electronically Signed   By: Donavan Foil M.D.   On:  02/21/2020 23:43    ____________________________________________   PROCEDURES   Procedure(s) performed (including Critical Care):  Procedures   ____________________________________________   INITIAL IMPRESSION / MDM / [place] / ED COURSE  As part of my medical decision making, I reviewed the following data within the [place] notes reviewed and incorporated, Radiograph reviewed , reviewed prior clinic notes, reviewed Notes from prior ED visits and [place] Controlled Substance Database   Differential diagnosis includes, but is not limited to, musculoskeletal strain, cervical radiculopathy, fracture/dislocation.  Radiographs are reassuring and physical exam is reassuring.  Symptoms are most consistent with musculoskeletal strain.  No evidence of any acute or emergent abnormality.  Patient is extremely sleepy and does  not seem to be in significant distress.  She has been on diclofenac previously which was apparently effective for her left-sided pain so I will start her back on it and encourage outpatient follow-up with Dr. Sharlet Salina in about a week if she is still having symptoms.           ____________________________________________  FINAL CLINICAL IMPRESSION(S) / ED DIAGNOSES  Final diagnoses:  Strain of right shoulder, initial encounter     MEDICATIONS GIVEN DURING THIS VISIT:  Medications - No data to display   ED Discharge Orders         Ordered    diclofenac (VOLTAREN) 75 MG EC tablet  2 times daily with meals     02/22/20 [id number]          *Please note:  Iver C Tweed was evaluated in Emergency Department on 02/22/2020 for the symptoms described in the history of present illness. She was evaluated in the context of the global COVID-19 pandemic, which necessitated consideration that the patient might be at risk for infection with the SARS-CoV-2 virus that causes COVID-19. Institutional protocols and algorithms that pertain to the  evaluation of patients at risk for COVID-19 are in a state of rapid change based on information released by regulatory bodies including the CDC and federal and state organizations. These policies and algorithms were followed during the patient's care in the ED.  Some ED evaluations and interventions may be delayed as a result of limited staffing during the pandemic.*  Note:  This document was prepared using Dragon voice recognition software and may include unintentional dictation errors.   Hinda Kehr, MD 02/22/20 [phone]

## 2020-02-23 ENCOUNTER — Ambulatory Visit: Payer: Medicaid Other | Attending: Oncology

## 2020-02-23 ENCOUNTER — Ambulatory Visit: Payer: Medicaid Other

## 2020-02-23 DIAGNOSIS — Z23 Encounter for immunization: Secondary | ICD-10-CM

## 2020-02-23 NOTE — Progress Notes (Signed)
   Covid-19 Vaccination Clinic  Name:  Amy Waller    MRN: [id number] DOB: [birth year]  02/23/2020  [name] was observed post Covid-19 immunization for 15 minutes without incident. She was provided with Vaccine Information Sheet and instruction to access the V-Safe system.   [name] was instructed to call 911 with any severe reactions post vaccine: Marland Kitchen Difficulty breathing  . Swelling of face and throat  . A fast heartbeat  . A bad rash all over body  . Dizziness and weakness   Immunizations Administered    Name Date Dose VIS Date Route   Pfizer COVID-19 Vaccine 02/23/2020 11:45 AM 0.3 mL 12/13/2018 Intramuscular   Manufacturer: [place]   Lot: [id number]   [place]: [id number]

## 2020-03-16 ENCOUNTER — Ambulatory Visit: Payer: Medicaid Other

## 2020-03-23 ENCOUNTER — Ambulatory Visit: Payer: Medicaid Other | Attending: Internal Medicine

## 2020-03-23 DIAGNOSIS — Z23 Encounter for immunization: Secondary | ICD-10-CM

## 2020-03-23 NOTE — Progress Notes (Signed)
   Covid-19 Vaccination Clinic  Name:  JOLENE GUYETT    MRN: [id number] DOB: [birth year]  03/23/2020  Ms. Patti was observed post Covid-19 immunization for 15 minutes without incident. She was provided with Vaccine Information Sheet and instruction to access the V-Safe system.   Ms. Mcclenney was instructed to call 911 with any severe reactions post vaccine: Marland Kitchen Difficulty breathing  . Swelling of face and throat  . A fast heartbeat  . A bad rash all over body  . Dizziness and weakness   Immunizations Administered    Name Date Dose VIS Date Route   Pfizer COVID-19 Vaccine 03/23/2020 11:46 AM 0.3 mL 12/13/2018 Intramuscular   Manufacturer: [employer]   Lot: [id number]   [place]: [id number]

## 2020-04-08 ENCOUNTER — Emergency Department
Admission: EM | Admit: 2020-04-08 | Discharge: 2020-04-08 | Disposition: A | Discharge status: Home / Self Care | Payer: Medicaid Other | Attending: Emergency Medicine | Admitting: Emergency Medicine

## 2020-04-08 ENCOUNTER — Other Ambulatory Visit: Payer: Self-pay

## 2020-04-08 ENCOUNTER — Emergency Department: Payer: Medicaid Other

## 2020-04-08 DIAGNOSIS — W1849XA Other slipping, tripping and stumbling without falling, initial encounter: Secondary | ICD-10-CM | POA: Diagnosis not present

## 2020-04-08 DIAGNOSIS — Y929 Unspecified place or not applicable: Secondary | ICD-10-CM | POA: Diagnosis not present

## 2020-04-08 DIAGNOSIS — Y999 Unspecified external cause status: Secondary | ICD-10-CM | POA: Diagnosis not present

## 2020-04-08 DIAGNOSIS — S92511A Displaced fracture of proximal phalanx of right lesser toe(s), initial encounter for closed fracture: Secondary | ICD-10-CM | POA: Diagnosis not present

## 2020-04-08 DIAGNOSIS — Y939 Activity, unspecified: Secondary | ICD-10-CM | POA: Insufficient documentation

## 2020-04-08 DIAGNOSIS — F17210 Nicotine dependence, cigarettes, uncomplicated: Secondary | ICD-10-CM | POA: Insufficient documentation

## 2020-04-08 DIAGNOSIS — I10 Essential (primary) hypertension: Secondary | ICD-10-CM | POA: Insufficient documentation

## 2020-04-08 DIAGNOSIS — S99922A Unspecified injury of left foot, initial encounter: Secondary | ICD-10-CM | POA: Diagnosis present

## 2020-04-08 MED ORDER — MELOXICAM 15 MG PO TABS: 15.0000 mg | ORAL_TABLET | Freq: Every day | ORAL | 0 refills | Status: DC

## 2020-04-08 MED ORDER — HYDROCODONE-ACETAMINOPHEN 5-325 MG PO TABS: 1.0000 | ORAL_TABLET | Freq: Four times a day (QID) | ORAL | 0 refills | Status: AC | PRN

## 2020-04-08 MED ORDER — HYDROCODONE-ACETAMINOPHEN 5-325 MG PO TABS
1.0000 | ORAL_TABLET | Freq: Once | ORAL | Status: AC
  Administered 2020-04-08: 1 via ORAL
  Filled 2020-04-08: qty 1

## 2020-04-08 NOTE — ED Triage Notes (Signed)
Pt to ED with left third toe pain after tripping three days ago. PT report the swelling worsened two days ago.

## 2020-04-12 NOTE — ED Provider Notes (Signed)
[facility] Emergency Department Provider Note ____________________________________________  Time seen: Approximately 12:22 PM  I have reviewed the triage vital signs and the nursing notes.   HISTORY  Chief Complaint Toe Pain    HPI Amy Waller is a 59 y.o. female who presents to the emergency department for evaluation and treatment of left third toe pain after she tripped 3 days ago. Swelling has increased. No relief with rest and elevation   Past Medical History:  Diagnosis Date  . Arthritis   . Dog bite 11-24-15   right thigh  . Hypertension   . Vaginal discharge     Patient Active Problem List   Diagnosis Date Noted  . Pelvic pain 10/06/2018  . Vaginal irritation 10/06/2018  . Uterine leiomyoma 10/06/2018  . Left arm weakness 02/07/2015  . Numbness and tingling in left arm 02/07/2015  . Neck pain on left side 12/19/2014  . Numbness of arm 12/19/2014    Past Surgical History:  Procedure Laterality Date  . [place]    Prior to Admission medications   Medication Sig Start Date End Date Taking? Authorizing Provider  atenolol (TENORMIN) 50 MG tablet Take 1 tablet (50 mg total) by mouth daily. 02/08/19   Hinda Kehr, MD  baclofen (LIORESAL) 10 MG tablet Take 1 tablet (10 mg total) by mouth 3 (three) times daily. 07/08/19 07/07/20  Fisher, Linden Dolin, PA-C  diclofenac (VOLTAREN) 75 MG EC tablet Take 1 tablet (75 mg total) by mouth 2 (two) times daily with a meal. Do not take with any other over-the-counter pain medications except for Tylenol (acetaminophen). 02/22/20   Hinda Kehr, MD  fluconazole (DIFLUCAN) 150 MG tablet Take 1 tablet (150 mg total) by mouth once for 1 dose if you are still having symptoms in 48 hours. 02/08/19   Hinda Kehr, MD  ibuprofen (ADVIL) 600 MG tablet Take 1 tablet (600 mg total) by mouth 3 (three) times daily. 07/08/19   Fisher, Linden Dolin, PA-C  lisinopril (PRINIVIL,ZESTRIL) 10 MG tablet Take 10 mg by  mouth daily.    [provider]  meloxicam (MOBIC) 15 MG tablet Take 1 tablet (15 mg total) by mouth daily. 04/08/20   Victorino Dike, FNP    Allergies Patient has no known allergies.  Family History  Problem Relation Age of Onset  . Cancer Mother 51       ovarian  . Hypertension Mother   . Ovarian cancer Mother   . Cancer Sister 53       throat  . Hypertension Sister   . Cancer Maternal Uncle        throat  . Hypertension Maternal Uncle   . Breast cancer Maternal Aunt   . Colon cancer Neg Hx     Social History Social History   Tobacco Use  . Smoking status: Current Every Day Smoker    Packs/day: 0.25    Years: 10.00    Pack years: 2.50    Types: Cigarettes  . Smokeless tobacco: Never Used  Vaping Use  . Vaping Use: Never used  Substance Use Topics  . Alcohol use: Yes    Comment: occasionally  . Drug use: Not Currently    Review of Systems Constitutional: Negative for fever. Cardiovascular: Negative for chest pain. Respiratory: Negative for shortness of breath. Musculoskeletal: Positive for left 3rd toe pain. Skin: Ecchymosis noted to left foot over 3rd toe.  Neurological: Negative for decrease in sensation  ____________________________________________   PHYSICAL EXAM:  VITAL SIGNS: ED Triage Vitals  Enc Vitals Group     BP 04/08/20 2111 (!) 124/98     Pulse Rate 04/08/20 2111 77     Resp 04/08/20 2111 16     Temp 04/08/20 2111 98.8 F (37.1 C)     Temp Source 04/08/20 2111 Oral     SpO2 04/08/20 2111 99 %     Weight 04/08/20 2111 116 lb (52.6 kg)     Height 04/08/20 2111 5\' 1"  (1.549 m)     Head Circumference --      Peak Flow --      Pain Score 04/08/20 2236 8     Pain Loc --      Pain Edu? --      Excl. in [place]? --     Constitutional: Alert and oriented. Well appearing and in no acute distress. Eyes: Conjunctivae are clear without discharge or drainage Head: Atraumatic Neck: Supple Respiratory: No cough. Respirations are even  and unlabored. Musculoskeletal: Focal tenderness over left foot, 3rd toe. Neurologic: Motor and sensory function is intact.  Skin: Ecchymosis of the left foot.  Psychiatric: Affect and behavior are appropriate.  ____________________________________________   LABS (all labs ordered are listed, but only abnormal results are displayed)  Labs Reviewed - No data to display ____________________________________________  RADIOLOGY  Proximal phalanx fracture third toe of the left foot  I, Manoah Deckard, personally viewed and evaluated these images (plain radiographs) as part of my medical decision making, as well as reviewing the written report by the radiologist.  No results found. ____________________________________________   PROCEDURES  Procedures  ____________________________________________   INITIAL IMPRESSION / ASSESSMENT AND PLAN / ED COURSE  Amy Waller is a 59 y.o. who presents to the emergency department for evaluation of left foot pain.  She tripped 3 days ago and has had pain in her foot.  See HPI for further details.  X-ray shows a fracture of the third toe.  Will buddy tape the toes and put her in a postop shoe.  Patient instructed to follow-up with podiatry if not improving.  She was also instructed to return to the emergency department for symptoms that change or worsen if unable schedule an appointment with orthopedics or primary care.  Medications  HYDROcodone-acetaminophen (NORCO/VICODIN) 5-325 MG per tablet 1 tablet (1 tablet Oral Given 04/08/20 2236)    Pertinent labs & imaging results that were available during my care of the patient were reviewed by me and considered in my medical decision making (see chart for details).   _________________________________________   FINAL CLINICAL IMPRESSION(S) / ED DIAGNOSES  Final diagnoses:  Closed displaced fracture of proximal phalanx of lesser toe of right foot, initial encounter    ED Discharge Orders          Ordered    HYDROcodone-acetaminophen (NORCO/VICODIN) 5-325 MG tablet  Every 6 hours PRN     Reprint     04/08/20 2231    meloxicam (MOBIC) 15 MG tablet  Daily     Discontinue  Reprint     04/08/20 2231           If controlled substance prescribed during this visit, 12 month history viewed on the [place] prior to issuing an initial prescription for Schedule II or III opiod.   Victorino Dike, FNP 04/12/20 1229    Delman Kitten, MD 04/20/20 [phone]

## 2020-06-13 ENCOUNTER — Other Ambulatory Visit: Payer: Self-pay

## 2020-06-13 ENCOUNTER — Emergency Department: Payer: Medicaid Other

## 2020-06-13 ENCOUNTER — Emergency Department
Admission: EM | Admit: 2020-06-13 | Discharge: 2020-06-13 | Disposition: A | Discharge status: Home / Self Care | Payer: Medicaid Other | Attending: Emergency Medicine | Admitting: Emergency Medicine

## 2020-06-13 DIAGNOSIS — G44311 Acute post-traumatic headache, intractable: Secondary | ICD-10-CM

## 2020-06-13 DIAGNOSIS — N898 Other specified noninflammatory disorders of vagina: Secondary | ICD-10-CM | POA: Insufficient documentation

## 2020-06-13 DIAGNOSIS — R519 Headache, unspecified: Secondary | ICD-10-CM | POA: Diagnosis not present

## 2020-06-13 DIAGNOSIS — Y999 Unspecified external cause status: Secondary | ICD-10-CM | POA: Insufficient documentation

## 2020-06-13 DIAGNOSIS — Y929 Unspecified place or not applicable: Secondary | ICD-10-CM | POA: Diagnosis not present

## 2020-06-13 DIAGNOSIS — F17210 Nicotine dependence, cigarettes, uncomplicated: Secondary | ICD-10-CM | POA: Insufficient documentation

## 2020-06-13 DIAGNOSIS — Z79899 Other long term (current) drug therapy: Secondary | ICD-10-CM | POA: Insufficient documentation

## 2020-06-13 DIAGNOSIS — S161XXA Strain of muscle, fascia and tendon at neck level, initial encounter: Secondary | ICD-10-CM | POA: Diagnosis not present

## 2020-06-13 DIAGNOSIS — Y939 Activity, unspecified: Secondary | ICD-10-CM | POA: Diagnosis not present

## 2020-06-13 DIAGNOSIS — I10 Essential (primary) hypertension: Secondary | ICD-10-CM | POA: Diagnosis not present

## 2020-06-13 DIAGNOSIS — S199XXA Unspecified injury of neck, initial encounter: Secondary | ICD-10-CM | POA: Diagnosis present

## 2020-06-13 LAB — URINALYSIS, COMPLETE (UACMP) WITH MICROSCOPIC
Bacteria, UA: NONE SEEN
Bilirubin Urine: NEGATIVE
Glucose, UA: NEGATIVE mg/dL
Ketones, ur: 5 mg/dL — AB
Leukocytes,Ua: NEGATIVE
Nitrite: NEGATIVE
Protein, ur: NEGATIVE mg/dL
Specific Gravity, Urine: 1.017 (ref 1.005–1.030)
pH: 5 (ref 5.0–8.0)

## 2020-06-13 LAB — WET PREP, GENITAL
Sperm: NONE SEEN
Trich, Wet Prep: NONE SEEN
Yeast Wet Prep HPF POC: NONE SEEN

## 2020-06-13 LAB — CHLAMYDIA/NGC RT PCR (ARMC ONLY)
Chlamydia Tr: NOT DETECTED
N gonorrhoeae: NOT DETECTED

## 2020-06-13 MED ORDER — NAPROXEN 500 MG PO TABS: 500.0000 mg | ORAL_TABLET | Freq: Two times a day (BID) | ORAL | 0 refills | Status: DC

## 2020-06-13 MED ORDER — METRONIDAZOLE 500 MG PO TABS: 500.0000 mg | ORAL_TABLET | Freq: Three times a day (TID) | ORAL | 0 refills | Status: AC
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
## 2020-06-13 NOTE — ED Notes (Signed)
Pt transported to CT ?
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
## 2020-08-28 DIAGNOSIS — Z1159 Encounter for screening for other viral diseases: Secondary | ICD-10-CM | POA: Diagnosis not present

## 2020-08-28 DIAGNOSIS — Z23 Encounter for immunization: Secondary | ICD-10-CM | POA: Diagnosis not present

## 2020-08-28 DIAGNOSIS — N760 Acute vaginitis: Secondary | ICD-10-CM | POA: Diagnosis not present

## 2020-08-28 DIAGNOSIS — I10 Essential (primary) hypertension: Secondary | ICD-10-CM | POA: Diagnosis not present

## 2020-08-28 DIAGNOSIS — F23 Brief psychotic disorder: Secondary | ICD-10-CM | POA: Diagnosis not present

## 2020-08-29 ENCOUNTER — Other Ambulatory Visit: Payer: Self-pay | Admitting: Physician Assistant

## 2020-08-29 DIAGNOSIS — Z1231 Encounter for screening mammogram for malignant neoplasm of breast: Secondary | ICD-10-CM

## 2020-09-05 ENCOUNTER — Encounter: Payer: Self-pay | Admitting: Physician Assistant

## 2020-11-02 ENCOUNTER — Other Ambulatory Visit: Payer: Self-pay

## 2020-11-02 ENCOUNTER — Other Ambulatory Visit: Payer: Medicaid Other

## 2020-11-02 DIAGNOSIS — Z20822 Contact with and (suspected) exposure to covid-19: Secondary | ICD-10-CM

## 2020-11-05 LAB — NOVEL CORONAVIRUS, NAA: SARS-CoV-2, NAA: DETECTED — AB

## 2020-11-06 ENCOUNTER — Ambulatory Visit: Payer: Medicaid Other | Admitting: Gastroenterology

## 2020-11-06 ENCOUNTER — Telehealth: Payer: Self-pay

## 2020-11-06 NOTE — Telephone Encounter (Signed)
Per Dr. Marius Ditch: Amy Waller and schedule 2:15 pt for direct screening colonoscopy if she is interested and not had before  Patient will need to rescheduled appointment and scheduled colonoscopy for screening per Dr. Marius Ditch.  Called and left a message for call back sent mychart message

## 2020-12-18 ENCOUNTER — Encounter: Payer: Self-pay | Admitting: *Deleted

## 2020-12-18 ENCOUNTER — Ambulatory Visit: Payer: Medicaid Other | Admitting: Gastroenterology

## 2021-02-11 ENCOUNTER — Other Ambulatory Visit: Payer: Self-pay

## 2021-02-12 ENCOUNTER — Encounter: Payer: Self-pay | Admitting: Gastroenterology

## 2021-02-12 ENCOUNTER — Ambulatory Visit (INDEPENDENT_AMBULATORY_CARE_PROVIDER_SITE_OTHER): Payer: Medicaid Other | Admitting: Gastroenterology

## 2021-02-12 ENCOUNTER — Other Ambulatory Visit: Payer: Self-pay

## 2021-02-12 VITALS — BP 129/85 | HR 61 | Temp 98.3°F | Ht 61.0 in | Wt 107.2 lb

## 2021-02-12 DIAGNOSIS — Z1211 Encounter for screening for malignant neoplasm of colon: Secondary | ICD-10-CM

## 2021-02-12 DIAGNOSIS — K5904 Chronic idiopathic constipation: Secondary | ICD-10-CM | POA: Diagnosis not present

## 2021-02-12 MED ORDER — NA SULFATE-K SULFATE-MG SULF 17.5-3.13-1.6 GM/177ML PO SOLN: 354.0000 mL | Freq: Once | ORAL | 0 refills | Status: AC

## 2021-02-12 MED ORDER — LUBIPROSTONE 24 MCG PO CAPS: 24.0000 ug | ORAL_CAPSULE | Freq: Two times a day (BID) | ORAL | 2 refills | Status: DC

## 2021-02-12 MED ORDER — MAGNESIUM CITRATE PO SOLN: 1.0000 | Freq: Once | ORAL | 0 refills | Status: AC

## 2021-02-12 NOTE — Patient Instructions (Signed)
1. Take magnesium Citrate today.    High-Fiber Eating Plan Fiber, also called dietary fiber, is a type of carbohydrate. It is found foods such as fruits, vegetables, whole grains, and beans. A high-fiber diet can have many health benefits. Your health care provider may recommend a high-fiber diet to help:  Prevent constipation. Fiber can make your bowel movements more regular.  Lower your cholesterol.  Relieve the following conditions: ? Inflammation of veins in the anus (hemorrhoids). ? Inflammation of specific areas of the digestive tract (uncomplicated diverticulosis). ? A problem of the large intestine, also called the colon, that sometimes causes pain and diarrhea (irritable bowel syndrome, or IBS).  Prevent overeating as part of a weight-loss plan.  Prevent heart disease, type 2 diabetes, and certain cancers. What are tips for following this plan? Reading food labels  Check the nutrition facts label on food products for the amount of dietary fiber. Choose foods that have 5 grams of fiber or more per serving.  The goals for recommended daily fiber intake include: ? Men (age 66 or younger): 34-38 g. ? Men (over age 31): 28-34 g. ? Women (age 57 or younger): 25-28 g. ? Women (over age 41): 22-25 g. Your daily fiber goal is _____________ g.   Shopping  Choose whole fruits and vegetables instead of processed forms, such as apple juice or applesauce.  Choose a wide variety of high-fiber foods such as avocados, lentils, oats, and kidney beans.  Read the nutrition facts label of the foods you choose. Be aware of foods with added fiber. These foods often have high sugar and sodium amounts per serving. Cooking  Use whole-grain flour for baking and cooking.  Cook with brown rice instead of white rice. Meal planning  Start the day with a breakfast that is high in fiber, such as a cereal that contains 5 g of fiber or more per serving.  Eat breads and cereals that are made with  whole-grain flour instead of refined flour or white flour.  Eat brown rice, bulgur wheat, or millet instead of white rice.  Use beans in place of meat in soups, salads, and pasta dishes.  Be sure that half of the grains you eat each day are whole grains. General information  You can get the recommended daily intake of dietary fiber by: ? Eating a variety of fruits, vegetables, grains, nuts, and beans. ? Taking a fiber supplement if you are not able to take in enough fiber in your diet. It is better to get fiber through food than from a supplement.  Gradually increase how much fiber you consume. If you increase your intake of dietary fiber too quickly, you may have bloating, cramping, or gas.  Drink plenty of water to help you digest fiber.  Choose high-fiber snacks, such as berries, raw vegetables, nuts, and popcorn. What foods should I eat? Fruits Berries. Pears. Apples. Oranges. Avocado. Prunes and raisins. Dried figs. Vegetables Sweet potatoes. Spinach. Kale. Artichokes. Cabbage. Broccoli. Cauliflower. Green peas. Carrots. Squash. Grains Whole-grain breads. Multigrain cereal. Oats and oatmeal. Brown rice. Barley. Bulgur wheat. [place]. Quinoa. Bran muffins. Popcorn. Rye wafer crackers. Meats and other proteins Navy beans, kidney beans, and pinto beans. Soybeans. Split peas. Lentils. Nuts and seeds. Dairy Fiber-fortified yogurt. Beverages Fiber-fortified soy milk. Fiber-fortified orange juice. Other foods Fiber bars. The items listed above may not be a complete list of recommended foods and beverages. Contact a dietitian for more information. What foods should I avoid? Fruits Fruit juice. Cooked, strained fruit.  Vegetables Fried potatoes. Canned vegetables. Well-cooked vegetables. Grains White bread. Pasta made with refined flour. White rice. Meats and other proteins Fatty cuts of meat. Fried chicken or fried fish. Dairy Milk. Yogurt. Cream cheese. Sour cream. Fats and  oils Butters. Beverages Soft drinks. Other foods Cakes and pastries. The items listed above may not be a complete list of foods and beverages to avoid. Talk with your dietitian about what choices are best for you. Summary  Fiber is a type of carbohydrate. It is found in foods such as fruits, vegetables, whole grains, and beans.  A high-fiber diet has many benefits. It can help to prevent constipation, lower blood cholesterol, aid weight loss, and reduce your risk of heart disease, diabetes, and certain cancers.  Increase your intake of fiber gradually. Increasing fiber too quickly may cause cramping, bloating, and gas. Drink plenty of water while you increase the amount of fiber you consume.  The best sources of fiber include whole fruits and vegetables, whole grains, nuts, seeds, and beans. This information is not intended to replace advice given to you by your health care provider. Make sure you discuss any questions you have with your health care provider. Document Revised: 02/08/2020 Document Reviewed: 02/08/2020 Elsevier Patient Education  2021 [employer].

## 2021-02-12 NOTE — Progress Notes (Signed)
Cephas Darby, MD [address]  [place]  [place], [place] [address]  Main: [phone]  Fax: [phone]    Gastroenterology Consultation  Referring Provider:     Kerri Perches, PA-C Primary Care Physician:  [facility] [place] Primary Gastroenterologist:  Dr. Cephas Darby Reason for Consultation:     Chronic constipation        HPI:   Amy Waller is a 60 y.o. female referred by Dr. Domingo Madeira, [place]  for consultation & management of chronic constipation.  Patient reports that she has been experiencing severe constipation since she was in her 14s.  She reports having bowel movement maybe once or twice every 2 weeks.  She attributes her infrequent bowel movements stool smoking, lack of appetite, lack of fiber.  She does drink several cans of sodas daily.  She is also concerned that she is losing weight.  She does report abdominal bloating.  Patient has not tried any stool softeners.  She denies any rectal pain, bleeding.  Her most recent labs from 11/21 revealed normal CBC, CMP.  Patient lives with her mom, on disability She denies alcohol use  NSAIDs: None  Antiplts/Anticoagulants/Anti thrombotics: None  GI Procedures: None She denies family history of GI malignancy  Past Medical History:  Diagnosis Date  . Arthritis   . Dog bite 11-24-15   right thigh  . Hypertension   . Vaginal discharge     Past Surgical History:  Procedure Laterality Date  . [place]    Current Outpatient Medications:  .  atenolol (TENORMIN) 50 MG tablet, Take 1 tablet (50 mg total) by mouth daily., Disp: 30 tablet, Rfl: 5 .  gabapentin (NEURONTIN) 100 MG capsule, Take 1 capsule by mouth 2 (two) times daily., Disp: , Rfl:  .  lubiprostone (AMITIZA) 24 MCG capsule, Take 1 capsule (24 mcg total) by mouth 2 (two) times daily with a meal., Disp: 60 capsule, Rfl: 2 .  Na Sulfate-K Sulfate-Mg Sulf 17.5-3.13-1.6 GM/177ML SOLN,  Take 354 mLs by mouth once for 1 dose., Disp: 354 mL, Rfl: 0 .  naproxen (NAPROSYN) 500 MG tablet, Take 1 tablet (500 mg total) by mouth 2 (two) times daily with a meal., Disp: 30 tablet, Rfl: 0 .  magnesium citrate SOLN, Take 296 mLs (1 Bottle total) by mouth once for 1 dose., Disp: 195 mL, Rfl: 0   Family History  Problem Relation Age of Onset  . Cancer Mother 45       ovarian  . Hypertension Mother   . Ovarian cancer Mother   . Cancer Sister 53       throat  . Hypertension Sister   . Cancer Maternal Uncle        throat  . Hypertension Maternal Uncle   . Breast cancer Maternal Aunt   . Colon cancer Neg Hx      Social History   Tobacco Use  . Smoking status: Current Every Day Smoker    Packs/day: 0.25    Years: 10.00    Pack years: 2.50    Types: Cigarettes  . Smokeless tobacco: Never Used  Vaping Use  . Vaping Use: Never used  Substance Use Topics  . Alcohol use: Yes    Comment: occasionally  . Drug use: Not Currently    Allergies as of 02/12/2021  . (No Known Allergies)    Review of Systems:    All systems reviewed and negative except where  noted in HPI.   Physical Exam:  BP 129/85 (BP Location: Left Arm, Patient Position: Sitting, Cuff Size: Normal)   Pulse 61   Temp 98.3 F (36.8 C) (Oral)   Ht 5\' 1"  (1.549 m)   Wt 107 lb 4 oz (48.6 kg)   LMP 10/18/2015   BMI 20.26 kg/m  Patient's last menstrual period was 10/18/2015.  General:   Alert,  Well-developed, well-nourished, pleasant and cooperative in NAD Head:  Normocephalic and atraumatic. Eyes:  Sclera clear, no icterus.   Conjunctiva pink. Ears:  Normal auditory acuity. Nose:  No deformity, discharge, or lesions. Mouth:  No deformity or lesions,oropharynx pink & moist. Neck:  Supple; no masses or thyromegaly. Lungs:  Respirations even and unlabored.  Clear throughout to auscultation.   No wheezes, crackles, or rhonchi. No acute distress. Heart:  Regular rate and rhythm; no murmurs, clicks, rubs, or  gallops. Abdomen:  Normal bowel sounds. Soft, non-tender and mildly distended, tympanic to percussion without masses, hepatosplenomegaly or hernias noted.  No guarding or rebound tenderness.   Rectal: Not performed Msk:  Symmetrical without gross deformities. Good, equal movement & strength bilaterally. Pulses:  Normal pulses noted. Extremities:  No clubbing or edema.  No cyanosis. Neurologic:  Alert and oriented x3;  grossly normal neurologically. Skin:  Intact without significant lesions or rashes. No jaundice. Psych:  Alert and cooperative. Normal mood and affect.  Imaging Studies: No abdominal imaging  Assessment and Plan:   Amy Waller is a 60 y.o. African-American female with history of chronic tobacco use is seen in consultation for chronic constipation  Chronic constipation Reiterated on high-fiber diet, adequate intake of water, information provided Discussed about fiber supplements Trial of Amitiza 24 MCG twice daily Magnesium citrate today for cleanout  Colon cancer screening Recommend colonoscopy with 2-day prep   Follow up in 3 months   Cephas Darby, MD

## 2021-02-12 NOTE — Progress Notes (Unsigned)
ma

## 2021-02-24 ENCOUNTER — Ambulatory Visit: Payer: Medicaid Other | Admitting: Certified Registered Nurse Anesthetist

## 2021-02-24 ENCOUNTER — Encounter: Payer: Self-pay | Admitting: Gastroenterology

## 2021-02-25 ENCOUNTER — Encounter
Admission: RE | Disposition: A | Discharge status: Home / Self Care | Payer: Self-pay | Source: Ambulatory Visit | Attending: Gastroenterology

## 2021-02-25 ENCOUNTER — Ambulatory Visit
Admission: RE | Admit: 2021-02-25 | Discharge: 2021-02-25 | Disposition: A | Discharge status: Home / Self Care | Payer: Medicaid Other | Source: Ambulatory Visit | Attending: Gastroenterology | Admitting: Gastroenterology

## 2021-02-25 ENCOUNTER — Encounter: Payer: Self-pay | Admitting: Gastroenterology

## 2021-02-25 ENCOUNTER — Telehealth: Payer: Self-pay

## 2021-02-25 DIAGNOSIS — Z79899 Other long term (current) drug therapy: Secondary | ICD-10-CM | POA: Insufficient documentation

## 2021-02-25 DIAGNOSIS — Z5309 Procedure and treatment not carried out because of other contraindication: Secondary | ICD-10-CM | POA: Insufficient documentation

## 2021-02-25 DIAGNOSIS — F17210 Nicotine dependence, cigarettes, uncomplicated: Secondary | ICD-10-CM | POA: Diagnosis not present

## 2021-02-25 DIAGNOSIS — Z1211 Encounter for screening for malignant neoplasm of colon: Secondary | ICD-10-CM | POA: Diagnosis not present

## 2021-02-25 HISTORY — DX: Trichomonal vulvovaginitis: A59.01

## 2021-02-25 HISTORY — DX: Headache, unspecified: R51.9

## 2021-02-25 HISTORY — DX: Other constipation: K59.09

## 2021-02-25 LAB — URINE DRUG SCREEN, QUALITATIVE (ARMC ONLY)
Amphetamines, Ur Screen: NOT DETECTED
Barbiturates, Ur Screen: NOT DETECTED
Benzodiazepine, Ur Scrn: NOT DETECTED
Cannabinoid 50 Ng, Ur ~~LOC~~: NOT DETECTED
Cocaine Metabolite,Ur ~~LOC~~: POSITIVE — AB
MDMA (Ecstasy)Ur Screen: NOT DETECTED
Methadone Scn, Ur: NOT DETECTED
Opiate, Ur Screen: NOT DETECTED
Phencyclidine (PCP) Ur S: NOT DETECTED
Tricyclic, Ur Screen: NOT DETECTED

## 2021-02-25 SURGERY — COLONOSCOPY WITH PROPOFOL
Anesthesia: General

## 2021-02-25 MED ORDER — SODIUM CHLORIDE 0.9 % IV SOLN: INTRAVENOUS | Status: DC

## 2021-02-25 NOTE — Telephone Encounter (Signed)
Sent mychart message

## 2021-02-25 NOTE — Telephone Encounter (Signed)
Tried to call patient but patient did not answer and mailbox is full

## 2021-02-25 NOTE — H&P (Deleted)
Cephas Darby, MD [address]  [place]  [place], [place] [address]  Main: [phone]  Fax: [phone] Pager: [phone]  Primary Care Physician:  [facility] [place] Primary Gastroenterologist:  Dr. Cephas Darby  Pre-Procedure History & Physical: HPI:  Amy Waller is a 60 y.o. female is here for an colonoscopy.   Past Medical History:  Diagnosis Date  . Arthritis   . Chronic constipation   . Dog bite 11-24-15   right thigh  . Headache   . Hypertension   . Trichomonal vaginitis   . Vaginal discharge     Past Surgical History:  Procedure Laterality Date  . [place]    Prior to Admission medications   Medication Sig Start Date End Date Taking? Authorizing Provider  atenolol (TENORMIN) 50 MG tablet Take 1 tablet (50 mg total) by mouth daily. 02/08/19  Yes Hinda Kehr, MD  gabapentin (NEURONTIN) 100 MG capsule Take 1 capsule by mouth 2 (two) times daily. 08/22/15  Yes [provider]  lubiprostone (AMITIZA) 24 MCG capsule Take 1 capsule (24 mcg total) by mouth 2 (two) times daily with a meal. 02/12/21  Yes Kellie Chisolm, Tally Due, MD  naproxen (NAPROSYN) 500 MG tablet Take 1 tablet (500 mg total) by mouth 2 (two) times daily with a meal. 06/13/20  Yes Triplett, Cari B, FNP    Allergies as of 02/12/2021  . (No Known Allergies)    Family History  Problem Relation Age of Onset  . Cancer Mother 29       ovarian  . Hypertension Mother   . Ovarian cancer Mother   . Cancer Sister 53       throat  . Hypertension Sister   . Cancer Maternal Uncle        throat  . Hypertension Maternal Uncle   . Breast cancer Maternal Aunt   . Colon cancer Neg Hx     Social History   Socioeconomic History  . Marital status: Single    Spouse name: Not on file  . Number of children: Not on file  . Years of education: Not on file  . Highest education level: Not on file  Occupational History  . Not on file  Tobacco Use   . Smoking status: Current Every Day Smoker    Packs/day: 0.25    Years: 10.00    Pack years: 2.50    Types: Cigarettes  . Smokeless tobacco: Never Used  Vaping Use  . Vaping Use: Never used  Substance and Sexual Activity  . Alcohol use: Yes    Comment: occasionally  . Drug use: Not Currently    Types: Cocaine  . Sexual activity: Yes    Birth control/protection: None  Other Topics Concern  . Not on file  Social History Narrative  . Not on file   Social Determinants of Health   Financial Resource Strain: Not on file  Food Insecurity: Not on file  Transportation Needs: Not on file  Physical Activity: Not on file  Stress: Not on file  Social Connections: Not on file  Intimate Partner Violence: Not on file    Review of Systems: See HPI, otherwise negative ROS  Physical Exam: BP (!) 148/103   Pulse 60   Temp (!) 97.1 F (36.2 C) (Temporal)   Resp 16   Ht 5\' 1"  (1.549 m)   Wt 47.6 kg   LMP 10/18/2015   SpO2 100%   BMI 19.84 kg/m  General:   Alert,  pleasant and cooperative in NAD Head:  Normocephalic and atraumatic. Neck:  Supple; no masses or thyromegaly. Lungs:  Clear throughout to auscultation.    Heart:  Regular rate and rhythm. Abdomen:  Soft, nontender and nondistended. Normal bowel sounds, without guarding, and without rebound.   Neurologic:  Alert and  oriented x4;  grossly normal neurologically.  Impression/Plan: Amy Waller is here for an colonoscopy to be performed for colon cancer screening  Risks, benefits, limitations, and alternatives regarding  colonoscopy have been reviewed with the patient.  Questions have been answered.  All parties agreeable.   Sherri Sear, MD  02/25/2021, 11:39 AM

## 2021-02-25 NOTE — Telephone Encounter (Signed)
-----   Message from Lin Landsman, MD sent at 02/25/2021 11:54 AM EDT ----- Regarding: Colonoscopy cancelled her colonoscopy, she is cocaine positive, she wants to come back next week. Please call her to reschedule her procedure  RV

## 2021-02-25 NOTE — Telephone Encounter (Signed)
I thought patient would have to wait 3 to 4 weeks to repeat due to it still showing up in her system. Per Dr. Marius Ditch she only needs to wait 5 days

## 2021-02-25 NOTE — Anesthesia Preprocedure Evaluation (Signed)
Anesthesia Evaluation  Patient identified by MRN, date of birth, ID band Patient awake    Reviewed: Allergy & Precautions, NPO status , Patient's Chart, lab work & pertinent test results  Airway Mallampati: II  TM Distance: >3 FB     Dental  (+) Teeth Intact   Pulmonary Current Smoker and Patient abstained from smoking.,    Pulmonary exam normal        Cardiovascular hypertension, Normal cardiovascular exam     Neuro/Psych  Headaches,  Neuromuscular disease negative psych ROS   GI/Hepatic negative GI ROS, Neg liver ROS,   Endo/Other    Renal/GU negative Renal ROS  negative genitourinary   Musculoskeletal  (+) Arthritis , Osteoarthritis,    Abdominal Normal abdominal exam  (+)   Peds negative pediatric ROS (+)  Hematology negative hematology ROS (+)   Anesthesia Other Findings Past Medical History: No date: Arthritis No date: Chronic constipation 11-24-15: Dog bite     Comment:  right thigh No date: Headache No date: Hypertension No date: Trichomonal vaginitis No date: Vaginal discharge  Reproductive/Obstetrics                             Anesthesia Physical Anesthesia Plan  ASA: II  Anesthesia Plan: General   Post-op Pain Management:    Induction: Intravenous  PONV Risk Score and Plan: Propofol infusion and TIVA  Airway Management Planned: Nasal Cannula  Additional Equipment:   Intra-op Plan:   Post-operative Plan:   Informed Consent: I have reviewed the patients History and Physical, chart, labs and discussed the procedure including the risks, benefits and alternatives for the proposed anesthesia with the patient or authorized representative who has indicated his/her understanding and acceptance.     Dental advisory given  Plan Discussed with: CRNA and Surgeon  Anesthesia Plan Comments:         Anesthesia Quick Evaluation

## 2021-02-25 NOTE — Progress Notes (Signed)
Patient UDS (+) and procedure cancelled by Anesthesia and Dr Marius Ditch.  Dr Marius Ditch spoke with the patient and due to patient preference, procedure to be rescheduled for next week.  Patient understands and agrees with the plan.

## 2021-02-25 NOTE — Telephone Encounter (Signed)
Tried to call patient but patient did not answer and mailbox is full  

## 2021-02-26 NOTE — Telephone Encounter (Signed)
Tried to call patient but mailbox is full  

## 2021-04-07 ENCOUNTER — Ambulatory Visit (INDEPENDENT_AMBULATORY_CARE_PROVIDER_SITE_OTHER): Payer: Medicaid Other | Admitting: Certified Nurse Midwife

## 2021-04-07 ENCOUNTER — Encounter: Payer: Self-pay | Admitting: Certified Nurse Midwife

## 2021-04-07 ENCOUNTER — Other Ambulatory Visit: Payer: Self-pay

## 2021-04-07 VITALS — BP 171/123 | HR 75 | Resp 16 | Ht 61.0 in | Wt 106.5 lb

## 2021-04-07 DIAGNOSIS — K5900 Constipation, unspecified: Secondary | ICD-10-CM | POA: Diagnosis not present

## 2021-04-07 DIAGNOSIS — N941 Unspecified dyspareunia: Secondary | ICD-10-CM | POA: Diagnosis not present

## 2021-04-07 DIAGNOSIS — Z86018 Personal history of other benign neoplasm: Secondary | ICD-10-CM | POA: Diagnosis not present

## 2021-04-07 DIAGNOSIS — R1031 Right lower quadrant pain: Secondary | ICD-10-CM | POA: Diagnosis not present

## 2021-04-07 NOTE — Progress Notes (Signed)
GYN ENCOUNTER NOTE  Subjective:       Amy Waller is a 60 y.o. [id number] female is here for gynecologic evaluation of the following issues:  1. Reports worsening pain with intercourse, constipation and intermittent abdominal pressure for the last few months; no relief with home treatment measures  Denies difficulty breathing or respiratory distress, chest pain, dysuria and leg pain or swelling.    Gynecologic History  Patient's last menstrual period was 10/18/2015.  Contraception: post menopausal status  Last Pap: 2017. Results were: normal  Last mammogram: 2013. Results were: normal  Obstetric History OB History  Gravida Para Term Preterm AB Living  4 2     2     SAB IAB Ectopic Multiple Live Births  1            # Outcome Date GA Lbr Len/2nd Weight Sex Delivery Anes PTL Lv  4 AB           3 SAB           2 Para           1 Para             Obstetric Comments  1st Menstrual Cycle:  14   1st Pregnancy: 25     Past Medical History:  Diagnosis Date   Arthritis    Chronic constipation    Dog bite 11-24-15   right thigh   Headache    Hypertension    Trichomonal vaginitis    Vaginal discharge     Past Surgical History:  Procedure Laterality Date   CESAREAN SECTION  1987. 1998    Current Outpatient Medications on File Prior to Visit  Medication Sig Dispense Refill   atenolol (TENORMIN) 50 MG tablet Take 1 tablet (50 mg total) by mouth daily. 30 tablet 5   gabapentin (NEURONTIN) 100 MG capsule Take 1 capsule by mouth 2 (two) times daily.     No current facility-administered medications on file prior to visit.    No Known Allergies  Social History   Socioeconomic History   Marital status: Single    Spouse name: Not on file   Number of children: Not on file   Years of education: Not on file   Highest education level: Not on file  Occupational History   Not on file  Tobacco Use   Smoking status: Every Day    Packs/day: 0.25    Years: 10.00    Pack  years: 2.50    Types: Cigarettes   Smokeless tobacco: Never  Vaping Use   Vaping Use: Never used  Substance and Sexual Activity   Alcohol use: Yes    Comment: occasionally   Drug use: Not Currently    Types: Cocaine   Sexual activity: Yes    Birth control/protection: None  Other Topics Concern   Not on file  Social History Narrative   Not on file   Social Determinants of Health   Financial Resource Strain: Not on file  Food Insecurity: Not on file  Transportation Needs: Not on file  Physical Activity: Not on file  Stress: Not on file  Social Connections: Not on file  Intimate Partner Violence: Not on file    Family History  Problem Relation Age of Onset   Cancer Mother 57       ovarian   Hypertension Mother    Ovarian cancer Mother    Cancer Sister 21       throat   Hypertension  Sister    Cancer Maternal Uncle        throat   Hypertension Maternal Uncle    Breast cancer Maternal Aunt    Colon cancer Neg Hx     The following portions of the patient's history were reviewed and updated as appropriate: allergies, current medications, past family history, past medical history, past social history, past surgical history and problem list.  Review of Systems  ROS negative except as noted above. Information obtained from patient.   Objective:   BP (!) 171/123   Pulse 75   Resp 16   Ht 5\' 1"  (1.549 m)   Wt 106 lb 8 oz (48.3 kg)   LMP 10/18/2015   BMI 20.12 kg/m   CONSTITUTIONAL: Well-developed, well-nourished female in no acute distress.   ABDOMEN: Soft, non distended; Lower quadrants tender to touch.   PELVIC:  External Genitalia: Normal  Vagina: Normal  Cervix: Normal  Uterus: Normal size, shape,consistency, mobile  Adnexa: Normal  MUSCULOSKELETAL: Normal range of motion. No tenderness.  No cyanosis, clubbing, or edema.   Assessment:   1. Right lower quadrant abdominal pain  - US PELVIC COMPLETE WITH TRANSVAGINAL; Future  2. History of uterine  fibroid  - US PELVIC COMPLETE WITH TRANSVAGINAL; Future  3. Dyspareunia in female  - US PELVIC COMPLETE WITH TRANSVAGINAL; Future  4. Constipation, unspecified constipation type  - US PELVIC COMPLETE WITH TRANSVAGINAL; Future     Plan:   Ultrasound ordered, see chart. Will contact patient after ultrasound to discuss management options.   Encouraged to take blood pressure medication as prescribed and follow up with PCP  Reviewed red flag symptoms and when to call.   RTC as needed.    Dani Gobble, CNM [facility], [facility] 04/07/21 5:28 PM

## 2021-04-07 NOTE — Patient Instructions (Signed)
Uterine Fibroids ?Uterine fibroids, also called leiomyomas, are noncancerous (benign) tumors that can grow in the uterus. They can cause heavy menstrual bleeding and pain. Fibroids may also grow in the fallopian tubes, cervix, or tissues (ligaments) near the uterus. ?You may have one or many fibroids. Fibroids vary in size, weight, and where they grow in the uterus. Some can become quite large. Most fibroids do not require medical treatment. ?What are the causes? ?The cause of this condition is not known. ?What increases the risk? ?You are more likely to develop this condition if you: ?Are in your 30s or 40s and have not gone through menopause. ?Have a family history of this condition. ?Are of African American descent. ?Started your menstrual period at age 10 or younger. ?Have never given birth. ?Are overweight or obese. ?What are the signs or symptoms? ?Many women do not have any symptoms. Symptoms of this condition may include: ?Heavy menstrual bleeding. ?Bleeding between menstrual periods. ?Pain and pressure in the pelvic area, between your hip bones. ?Pain during sex. ?Bladder problems, such as needing to urinate right away or more often than usual. ?Inability to have children (infertility). ?Failure to carry pregnancy to term (miscarriage). ?How is this diagnosed? ?This condition may be diagnosed based on: ?Your symptoms and medical history. ?A physical exam. ?A pelvic exam that includes feeling for any tumors. ?Imaging tests, such as ultrasound or MRI. ?How is this treated? ?Treatment for this condition may include follow-up visits with your health care provider to monitor your fibroids for any changes. Other treatment may include: ?Medicines, such as: ?Medicines to relieve pain, including aspirin and NSAIDs, such as ibuprofen or naproxen. ?Hormone therapy. Treatment may be given as a pill or an injection, or it may be inserted into the uterus using an intrauterine device (IUD). ?Surgery that would do one of  the following: ?Remove the fibroids (myomectomy). This may be recommended if fibroids affect your fertility and you want to become pregnant. ?Remove the uterus (hysterectomy). ?Block the blood supply to the fibroids (uterine artery embolization). This can cause them to shrink and die. ?Follow these instructions at home: ?Medicines ?Take over-the-counter and prescription medicines only as told by your health care provider. ?Ask your health care provider if you should take iron pills or eat more iron-rich foods, such as dark green, leafy vegetables. Heavy menstrual bleeding can cause low iron levels. ?Managing pain ?If directed, apply heat to your back or abdomen to reduce pain. Use the heat source that your health care provider recommends, such as a moist heat pack or a heating pad. To apply heat: ?Place a towel between your skin and the heat source. ?Leave the heat on for 20-30 minutes. ?Remove the heat if your skin turns bright red. This is especially important if you are unable to feel pain, heat, or cold. You may have a greater risk of getting burned. ? ?General instructions ?Pay close attention to your menstrual cycle. Tell your health care provider about any changes, such as: ?Heavier bleeding that requires you to change your pads or tampons more than usual. ?A change in the number of days that your menstrual period lasts. ?A change in symptoms that come with your menstrual period, such as back pain or cramps in your abdomen. ?Keep all follow-up visits. This is important, especially if your fibroids need to be monitored for any changes. ?Contact a health care provider if you: ?Have pelvic pain, back pain, or cramps in your abdomen that do not get better with medicine   or heat. ?Develop new bleeding between menstrual periods. ?Have increased bleeding during or between menstrual periods. ?Feel more tired or weak than usual. ?Feel light-headed. ?Get help right away if you: ?Faint. ?Have pelvic pain that suddenly  gets worse. ?Have severe vaginal bleeding that soaks a tampon or pad in 30 minutes or less. ?Summary ?Uterine fibroids are noncancerous (benign) tumors that can develop in the uterus. ?The exact cause of this condition is not known. ?Most fibroids do not require medical treatment unless they affect your ability to have children (fertility). ?Contact a health care provider if you have pelvic pain, back pain, or cramps in your abdomen that do not get better with medicines. ?Get help right away if you faint, have pelvic pain that suddenly gets worse, or have severe vaginal bleeding. ?This information is not intended to replace advice given to you by your health care provider. Make sure you discuss any questions you have with your health care provider. ?Document Revised: 05/07/2020 Document Reviewed: 05/07/2020 ?Elsevier Patient Education ? 2022 Elsevier Inc. ? ?

## 2021-04-18 ENCOUNTER — Emergency Department
Admission: EM | Admit: 2021-04-18 | Discharge: 2021-04-19 | Disposition: A | Discharge status: Home / Self Care | Payer: Medicaid Other | Attending: Emergency Medicine | Admitting: Emergency Medicine

## 2021-04-18 ENCOUNTER — Other Ambulatory Visit: Payer: Self-pay

## 2021-04-18 DIAGNOSIS — I10 Essential (primary) hypertension: Secondary | ICD-10-CM | POA: Diagnosis not present

## 2021-04-18 DIAGNOSIS — Z20822 Contact with and (suspected) exposure to covid-19: Secondary | ICD-10-CM | POA: Insufficient documentation

## 2021-04-18 DIAGNOSIS — F17210 Nicotine dependence, cigarettes, uncomplicated: Secondary | ICD-10-CM | POA: Diagnosis not present

## 2021-04-18 DIAGNOSIS — Z79899 Other long term (current) drug therapy: Secondary | ICD-10-CM | POA: Diagnosis not present

## 2021-04-18 DIAGNOSIS — F29 Unspecified psychosis not due to a substance or known physiological condition: Secondary | ICD-10-CM | POA: Insufficient documentation

## 2021-04-18 DIAGNOSIS — Z046 Encounter for general psychiatric examination, requested by authority: Secondary | ICD-10-CM | POA: Diagnosis present

## 2021-04-18 DIAGNOSIS — F14250 Cocaine dependence with cocaine-induced psychotic disorder with delusions: Secondary | ICD-10-CM | POA: Diagnosis present

## 2021-04-18 DIAGNOSIS — F14151 Cocaine abuse with cocaine-induced psychotic disorder with hallucinations: Secondary | ICD-10-CM | POA: Diagnosis not present

## 2021-04-18 DIAGNOSIS — Y900 Blood alcohol level of less than 20 mg/100 ml: Secondary | ICD-10-CM | POA: Insufficient documentation

## 2021-04-18 LAB — RESP PANEL BY RT-PCR (FLU A&B, COVID) ARPGX2
Influenza A by PCR: NEGATIVE
Influenza B by PCR: NEGATIVE
SARS Coronavirus 2 by RT PCR: NEGATIVE

## 2021-04-18 LAB — CBC
HCT: 42.1 % (ref 36.0–46.0)
Hemoglobin: 14.5 g/dL (ref 12.0–15.0)
MCH: 30.7 pg (ref 26.0–34.0)
MCHC: 34.4 g/dL (ref 30.0–36.0)
MCV: 89.2 fL (ref 80.0–100.0)
Platelets: 305 10*3/uL (ref 150–400)
RBC: 4.72 MIL/uL (ref 3.87–5.11)
RDW: 14.8 % (ref 11.5–15.5)
WBC: 5.5 10*3/uL (ref 4.0–10.5)
nRBC: 0 % (ref 0.0–0.2)

## 2021-04-18 LAB — COMPREHENSIVE METABOLIC PANEL
ALT: 19 U/L (ref 0–44)
AST: 22 U/L (ref 15–41)
Albumin: 4.7 g/dL (ref 3.5–5.0)
Alkaline Phosphatase: 70 U/L (ref 38–126)
Anion gap: 8 (ref 5–15)
BUN: 13 mg/dL (ref 6–20)
CO2: 23 mmol/L (ref 22–32)
Calcium: 9.6 mg/dL (ref 8.9–10.3)
Chloride: 106 mmol/L (ref 98–111)
Creatinine, Ser: 0.89 mg/dL (ref 0.44–1.00)
GFR, Estimated: >60 mL/min (ref >60)
Glucose, Bld: 105 mg/dL — ABNORMAL HIGH (ref 70–99)
Potassium: 3.2 mmol/L — ABNORMAL LOW (ref 3.5–5.1)
Sodium: 137 mmol/L (ref 135–145)
Total Bilirubin: 1.2 mg/dL (ref 0.3–1.2)
Total Protein: 8.3 g/dL — ABNORMAL HIGH (ref 6.5–8.1)

## 2021-04-18 LAB — URINE DRUG SCREEN, QUALITATIVE (ARMC ONLY)
Amphetamines, Ur Screen: NOT DETECTED
Barbiturates, Ur Screen: NOT DETECTED
Benzodiazepine, Ur Scrn: NOT DETECTED
Cannabinoid 50 Ng, Ur ~~LOC~~: NOT DETECTED
Cocaine Metabolite,Ur ~~LOC~~: POSITIVE — AB
MDMA (Ecstasy)Ur Screen: NOT DETECTED
Methadone Scn, Ur: NOT DETECTED
Opiate, Ur Screen: NOT DETECTED
Phencyclidine (PCP) Ur S: NOT DETECTED
Tricyclic, Ur Screen: NOT DETECTED

## 2021-04-18 LAB — ETHANOL: Alcohol, Ethyl (B): <10 mg/dL (ref <10)

## 2021-04-18 LAB — SALICYLATE LEVEL
Salicylate Lvl: 7.3 mg/dL (ref 7.0–30.0)
Salicylate Lvl: <7 mg/dL — ABNORMAL LOW (ref 7.0–30.0)

## 2021-04-18 LAB — ACETAMINOPHEN LEVEL: Acetaminophen (Tylenol), Serum: <10 ug/mL — ABNORMAL LOW (ref 10–30)

## 2021-04-18 MED ORDER — GABAPENTIN 300 MG PO CAPS
300.0000 mg | ORAL_CAPSULE | Freq: Three times a day (TID) | ORAL | Status: DC
  Administered 2021-04-18 – 2021-04-19 (×3): 300 mg via ORAL
  Filled 2021-04-18 (×3): qty 1

## 2021-04-18 MED ORDER — POTASSIUM CHLORIDE CRYS ER 20 MEQ PO TBCR
40.0000 meq | EXTENDED_RELEASE_TABLET | Freq: Once | ORAL | Status: AC
  Administered 2021-04-18: 40 meq via ORAL
  Filled 2021-04-18: qty 2

## 2021-04-18 MED ORDER — HALOPERIDOL 0.5 MG PO TABS
1.0000 mg | ORAL_TABLET | Freq: Every day | ORAL | Status: DC
  Administered 2021-04-18: 1 mg via ORAL
  Filled 2021-04-18: qty 2

## 2021-04-18 MED ORDER — ATENOLOL 25 MG PO TABS
50.0000 mg | ORAL_TABLET | Freq: Every day | ORAL | Status: DC
Start: 2021-04-18 — End: 2021-04-19
  Administered 2021-04-18 – 2021-04-19 (×2): 50 mg via ORAL
  Filled 2021-04-18 (×2): qty 2

## 2021-04-18 NOTE — ED Notes (Signed)
Pt eating lunch tray  

## 2021-04-18 NOTE — Consult Note (Signed)
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 female patient, "Sorta kinda got to arguing".  HPI:  60 year old female presented to ED via [employer] after allegedly chasing her daughter around after she found out her daughter and her boyfriend have been having a secret relationship behind her back. Upon initial encounter, patient was sleeping on her bed in the hallway and having difficulty responding to any questions, but was prompted to sit up and became relatively lucid shortly afterwards. Patient was intentionally vague regarding the incident that led to her being brought her by the police and states her actual reason for being at the ED is because of her blood pressure. She later admits that she and her daughter "Rulon Sera kinda got to arguing" but continues to deny chasing her. Unclear whether or not her daughter is, in fact, in an intimate relationship with her boyfriend or if this is paranoia brought on by cocaine use, but patient states she has seen text messages between them confirming their relationship, even stating that she found out about them in April 2022. Will need further clarification. Patient endorses drinking one beer last night and about $10 worth of cocaine, unclear as to the exact amount, but seemingly less than she uses usually. Previous UDS history suggests this cocaine use is chronic. Patient denies any suicidal ideation, but endorses vague desire to cause harm to her daughter and her boyfriend. Notably BP was measured at 217/136 upon arrival. Patient states her only medications are for  blood pressure (which she has not been taking recently) and Diflucan for a yeast infection. She states she has a past history of counseling treatment and medication for PTSD and depression, and states her mother and sister both have a similar history. She states she is not currently on any psychiatric medications and denies any psychiatric hospital admissions. In order to get a more comprehensive history, patient suggested we talk to her friend, Jaclyn Shaggy [phone]). Jaclyn Shaggy states patient has a history of bipolar and is on medication for such, but does not know the name nor does she think patient is currently taking it. She also states that patient has a history of hallucinations and paranoia regarding her boyfriend hiding under her bed, thoughts people are trying to harm her or take her children, and thoughts that her hair is on fire. Towards the end of the interview, patient started to complain of left sided pain, and in the context of her blood pressure, warranted more urgent evaluation by the emergency physician. ED nurse and physician informed.  Past Psychiatric History: substance abuse  Risk to Self:  none Risk to Others:  none Prior Inpatient Therapy:  none Prior Outpatient Therapy:  none  Past Medical History:  Past Medical History:  Diagnosis Date   Arthritis    Chronic constipation    Dog bite 11-24-15   right thigh   Headache    Hypertension    Trichomonal vaginitis    Vaginal discharge     Past Surgical History:  Procedure Laterality Date   CESAREAN SECTION  19. 1998   Family History:  Family History  Problem Relation Age of  Onset   Cancer Mother 22       ovarian   Hypertension Mother    Ovarian cancer Mother    Cancer Sister 72       throat   Hypertension Sister    Cancer Maternal Uncle        throat   Hypertension Maternal Uncle    Breast cancer Maternal Aunt    Colon cancer Neg Hx    Family Psychiatric  History: none Social History:  Social History    Substance and Sexual Activity  Alcohol Use Yes   Comment: occasionally     Social History   Substance and Sexual Activity  Drug Use Not Currently   Types: Cocaine    Social History   Socioeconomic History   Marital status: Single    Spouse name: Not on file   Number of children: Not on file   Years of education: Not on file   Highest education level: Not on file  Occupational History   Not on file  Tobacco Use   Smoking status: Every Day    Packs/day: 0.25    Years: 10.00    Pack years: 2.50    Types: Cigarettes   Smokeless tobacco: Never  Vaping Use   Vaping Use: Never used  Substance and Sexual Activity   Alcohol use: Yes    Comment: occasionally   Drug use: Not Currently    Types: Cocaine   Sexual activity: Yes    Birth control/protection: None  Other Topics Concern   Not on file  Social History Narrative   Not on file   Social Determinants of Health   Financial Resource Strain: Not on file  Food Insecurity: Not on file  Transportation Needs: Not on file  Physical Activity: Not on file  Stress: Not on file  Social Connections: Not on file   Additional Social History:    Allergies:  No Known Allergies  Labs:  Results for orders placed or performed during the hospital encounter of 04/18/21 (from the past 48 hour(s))  Comprehensive metabolic panel     Status: Abnormal   Collection Time: 04/18/21  9:34 AM  Result Value Ref Range   Sodium 137 135 - 145 mmol/L   Potassium 3.2 (L) 3.5 - 5.1 mmol/L   Chloride 106 98 - 111 mmol/L   CO2 23 22 - 32 mmol/L   Glucose, Bld 105 (H) 70 - 99 mg/dL    Comment: Glucose reference range applies only to samples taken after fasting for at least 8 hours.   BUN 13 6 - 20 mg/dL   Creatinine, Ser 0.89 0.44 - 1.00 mg/dL   Calcium 9.6 8.9 - 10.3 mg/dL   Total Protein 8.3 (H) 6.5 - 8.1 g/dL   Albumin 4.7 3.5 - 5.0 g/dL   AST 22 15 - 41 U/L   ALT 19 0 - 44 U/L   Alkaline Phosphatase 70 38 - 126 U/L   Total Bilirubin  1.2 0.3 - 1.2 mg/dL   GFR, Estimated >60 >60 mL/min    Comment: (NOTE) Calculated using the CKD-EPI Creatinine Equation (2021)    Anion gap 8 5 - 15    Comment: Performed at [facility], [address] [place], [place] [address]  Ethanol     Status: None   Collection Time: 04/18/21  9:34 AM  Result Value Ref Range   Alcohol, Ethyl (B) <10 <10 mg/dL    Comment: (NOTE) Lowest detectable limit for serum alcohol is 10  mg/dL.  For medical purposes only. Performed at [facility], [address] [place], [place] [address]   Salicylate level     Status: None   Collection Time: 04/18/21  9:34 AM  Result Value Ref Range   Salicylate Lvl 7.3 7.0 - 30.0 mg/dL    Comment: Performed at [facility], [address] [place], [place] [address]  Acetaminophen level     Status: Abnormal   Collection Time: 04/18/21  9:34 AM  Result Value Ref Range   Acetaminophen (Tylenol), Serum <10 (L) 10 - 30 ug/mL    Comment: (NOTE) Therapeutic concentrations vary significantly. A range of 10-30 ug/mL  may be an effective concentration for many patients. However, some  are best treated at concentrations outside of this range. Acetaminophen concentrations >150 ug/mL at 4 hours after ingestion  and >50 ug/mL at 12 hours after ingestion are often associated with  toxic reactions.  Performed at [facility], [address] [place], [place] [address]   cbc     Status: None   Collection Time: 04/18/21  9:34 AM  Result Value Ref Range   WBC 5.5 4.0 - 10.5 K/uL   RBC 4.72 3.87 - 5.11 MIL/uL   Hemoglobin 14.5 12.0 - 15.0 g/dL   HCT 42.1 36.0 - 46.0 %   MCV 89.2 80.0 - 100.0 fL   MCH 30.7 26.0 - 34.0 pg   MCHC 34.4 30.0 - 36.0 g/dL   RDW 14.8 11.5 - 15.5 %   Platelets 305 150 - 400 K/uL   nRBC 0.0 0.0 - 0.2 %    Comment: Performed at [facility], [address] [place], [place] [address]    Current Facility-Administered Medications  Medication  Dose Route Frequency Provider Last Rate Last Admin   potassium chloride SA (KLOR-CON) CR tablet 40 mEq  40 mEq Oral Once Blake Divine, MD       Current Outpatient Medications  Medication Sig Dispense Refill   atenolol (TENORMIN) 50 MG tablet Take 1 tablet (50 mg total) by mouth daily. 30 tablet 5   fluconazole (DIFLUCAN) 150 MG tablet Take 150 mg by mouth every 3 (three) days.      Musculoskeletal: Strength & Muscle Tone: within normal limits Gait & Station: normal Patient leans: N/A  Psychiatric Specialty Exam: Physical Exam Vitals and nursing note reviewed.  Constitutional:      Appearance: Normal appearance.  HENT:     Head: Normocephalic.     Nose: Nose normal.  Pulmonary:     Effort: Pulmonary effort is normal.  Musculoskeletal:        General: Normal range of motion.     Cervical back: Normal range of motion.  Neurological:     Mental Status: She is alert and oriented to person, place, and time. Mental status is at baseline.  Psychiatric:        Attention and Perception: Attention and perception normal.        Mood and Affect: Mood is anxious.        Speech: Speech normal.        Behavior: Behavior normal. Behavior is cooperative.        Thought Content: Thought content normal.        Cognition and Memory: Cognition and memory normal.        Judgment: Judgment is impulsive.    Review of Systems  Psychiatric/Behavioral:  Positive for substance abuse. The patient is nervous/anxious.   All other systems reviewed  and are negative.  Blood pressure (!) 217/136, pulse 95, temperature 98.9 F (37.2 C), temperature source Oral, resp. rate 18, height 5\' 1"  (1.549 m), weight 38.6 kg, last menstrual period 10/18/2015, SpO2 93 %.Body mass index is 16.06 kg/m.  General Appearance: Disheveled  Eye Contact:  Good  Speech:  Normal Rate  Volume:  Decreased  Mood:  Anxious  Affect:  Congruent  Thought Process:  Goal Directed  Orientation:  Full (Time, Place, and Person)   Thought Content:  Delusions and Paranoid Ideation  Suicidal Thoughts:  No  Homicidal Thoughts:  Yes.  without intent/plan  Memory:  Immediate;   Fair Recent;   Fair Remote;   Fair  Judgement:  Impaired  Insight:  Lacking  Psychomotor Activity:  Decreased  Concentration:  Concentration: Fair and Attention Span: Fair  Recall:  [employer] of Knowledge:  Fair  Language:  Good  Akathisia:  No  Handed:  Right  AIMS (if indicated):     Assets:  Housing Leisure Time Physical Health Resilience Social Support  ADL's:  Intact  Cognition:  Impaired,  Mild  Sleep:        Physical Exam: Physical Exam Vitals and nursing note reviewed.  Constitutional:      Appearance: Normal appearance.  HENT:     Head: Normocephalic.     Nose: Nose normal.  Pulmonary:     Effort: Pulmonary effort is normal.  Musculoskeletal:        General: Normal range of motion.     Cervical back: Normal range of motion.  Neurological:     Mental Status: She is alert and oriented to person, place, and time. Mental status is at baseline.  Psychiatric:        Attention and Perception: Attention and perception normal.        Mood and Affect: Mood is anxious.        Speech: Speech normal.        Behavior: Behavior normal. Behavior is cooperative.        Thought Content: Thought content normal.        Cognition and Memory: Cognition and memory normal.        Judgment: Judgment is impulsive.   Review of Systems  Psychiatric/Behavioral:  Positive for substance abuse. The patient is nervous/anxious.   All other systems reviewed and are negative. Blood pressure (!) 217/136, pulse 95, temperature 98.9 F (37.2 C), temperature source Oral, resp. rate 18, height 5\' 1"  (1.549 m), weight 38.6 kg, last menstrual period 10/18/2015, SpO2 93 %. Body mass index is 16.06 kg/m.  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  Waylan Boga, NP 04/18/2021 1:41 PM

## 2021-04-18 NOTE — ED Triage Notes (Signed)
Pt brought into the ED via [employer] under IVC, states they were called to the seen. Pt reports chasing her daughter and following her around with . Pt is rambling in triage to the officer with random thoughts.
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
## 2021-04-18 NOTE — ED Notes (Signed)
Pt given dinner tray an juice. Pt stated she would eat when she wakes up. No other concerns at this time.

## 2021-04-18 NOTE — ED Notes (Signed)
Pt has money in safe, key in [employer].

## 2021-04-18 NOTE — ED Notes (Signed)
IVC PENDING  PLACEMENT  SEEN  BY  Gust Rung  NP

## 2021-04-18 NOTE — ED Notes (Signed)
IVC moved to [facility] 4
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
## 2021-04-18 NOTE — ED Notes (Signed)
Patient arrived fro quad area and went roght to bed. Unable to assess patient at present time. Will monitor for safety

## 2021-04-18 NOTE — ED Notes (Addendum)
Pt Belongings: 4 silver rings, 1 silver bracelet and 1 pair of silver earrings. Black jacket, green t shirt, black shoes, black pair of socks, black shorts, black bra, blue underwear, black underwear, blue lighter,keys and screwdriver.  Pt has $182 in cash, security called to secure cash in the safe.

## 2021-04-19 NOTE — ED Notes (Signed)
VS assess. Shower offered. Breakfast tray given. No other needs found.

## 2021-04-19 NOTE — Discharge Instructions (Signed)
[facility]  Mental health service in [place], [place]  Address: [address], [place], [place] [address] Hours:  Closed ? Opens 8?AM Mon Phone: [phone] 

## 2021-04-19 NOTE — ED Notes (Signed)
Patient provided with shower and oral hygiene supplies. Patient showered. Patient aware will be discharged to home today.

## 2021-04-19 NOTE — ED Provider Notes (Signed)
Emergency Medicine Observation Re-evaluation Note  Amy Waller is a 60 y.o. female, seen on rounds today.  Pt initially presented to the ED for complaints of Psychiatric Evaluation Currently, the patient is resting.  Physical Exam  BP 101/67   Pulse (!) 52   Temp 97.6 F (36.4 C) (Oral)   Resp 18   Ht 5\' 1"  (1.549 m)   Wt 38.6 kg   LMP 10/18/2015   SpO2 98%   BMI 16.06 kg/m  Physical Exam General: NAD Cardiac: well perfused Lungs: unlabored resp Psych: currently calm and resting  ED Course / MDM  EKG:   I have reviewed the labs performed to date as well as medications administered while in observation.  Recent changes in the last 24 hours include none.  Plan  Current plan is for psych eval. Patient is under full IVC at this time.   Merlyn Lot, MD 04/19/21 [phone]

## 2021-04-19 NOTE — ED Notes (Signed)
Pt given lunch tray.

## 2021-04-19 NOTE — Consult Note (Signed)
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  Medications started and she slept/ate in the ED.  Today, she is clear and coherent with no psychosis, paranoia, suicidal/homicidal ideations, or withdrawal symptoms.  She wants to leave and go to work.  Encouraged her to get assistance via [facility] for substance abuse, information provided in discharge instructions.  Psychiatrically cleared for discharge.  Total Time spent with patient: 45 minutes  Past Psychiatric History: substance abuse  Past Medical History:  Past Medical History:  Diagnosis Date   Arthritis    Chronic constipation    Dog bite 11-24-15   right thigh   Headache    Hypertension    Trichomonal vaginitis    Vaginal discharge     Past Surgical History:  Procedure Laterality Date   CESAREAN SECTION  1987. 1998   Family History:  Family History  Problem Relation Age of Onset   Cancer Mother 22       ovarian   Hypertension Mother    Ovarian cancer Mother    Cancer Sister 40       throat   Hypertension Sister    Cancer Maternal Uncle        throat   Hypertension Maternal Uncle    Breast cancer Maternal Aunt    Colon cancer Neg Hx    Family Psychiatric  History: none Social History:  Social History   Substance and Sexual Activity  Alcohol Use Yes   Comment: occasionally     Social History   Substance and Sexual Activity  Drug Use Not Currently   Types: Cocaine    Social History    Socioeconomic History   Marital status: Single    Spouse name: Not on file   Number of children: Not on file   Years of education: Not on file   Highest education level: Not on file  Occupational History   Not on file  Tobacco Use   Smoking status: Every Day    Packs/day: 0.25    Years: 10.00    Pack years: 2.50    Types: Cigarettes   Smokeless tobacco: Never  Vaping Use   Vaping Use: Never used  Substance and Sexual Activity   Alcohol use: Yes    Comment: occasionally   Drug use: Not Currently    Types: Cocaine   Sexual activity: Yes    Birth control/protection: None  Other Topics Concern   Not on file  Social History Narrative   Not on file   Social Determinants of Health   Financial Resource Strain: Not on file  Food Insecurity: Not on file  Transportation Needs: Not on file  Physical Activity: Not on file  Stress: Not on file  Social Connections: Not on file    Tobacco Cessation:  A prescription for an FDA-approved tobacco cessation medication was offered at discharge and the patient refused  Current Medications: Current Facility-Administered Medications  Medication Dose Route Frequency Provider Last Rate Last Admin   atenolol (TENORMIN) tablet 50 mg  50 mg Oral Daily Patrecia Pour, NP   50 mg at 04/19/21 4742   gabapentin (NEURONTIN) capsule 300 mg  300 mg Oral TID Patrecia Pour, NP   300 mg at 04/19/21 1000   haloperidol (HALDOL) tablet 1 mg  1 mg Oral QHS Patrecia Pour, NP   1 mg at 04/18/21 2112   Current Outpatient Medications  Medication Sig Dispense Refill   atenolol (TENORMIN) 50 MG tablet Take 1 tablet (50 mg total) by mouth daily. 30 tablet 5   fluconazole (DIFLUCAN) 150 MG tablet Take 150 mg by mouth every 3 (three) days.     PTA Medications: (Not in a hospital admission)   Musculoskeletal: Strength & Muscle Tone: within normal limits Gait & Station: normal Patient leans: N/A  Psychiatric Specialty Exam: Physical Exam Vitals and  nursing note reviewed.  Constitutional:      Appearance: Normal appearance.  HENT:     Head: Normocephalic.  Musculoskeletal:        General: Normal range of motion.     Cervical back: Normal range of motion.  Neurological:     General: No focal deficit present.     Mental Status: She is alert and oriented to person, place, and time.  Psychiatric:        Attention and Perception: Attention and perception normal.        Mood and Affect: Mood is anxious.        Speech: Speech normal.        Behavior: Behavior normal. Behavior is cooperative.        Thought Content: Thought content normal.        Cognition and Memory: Cognition and memory normal.        Judgment: Judgment normal.    Review of Systems  Psychiatric/Behavioral:  Positive for substance abuse. The patient is nervous/anxious.   All other systems reviewed and are negative.  Blood pressure 111/87, pulse 65, temperature 98.4 F (36.9 C), temperature source Oral, resp. rate 18, height 5\' 1"  (1.549 m), weight 38.6 kg, last menstrual period 10/18/2015, SpO2 98 %.Body mass index is 16.06 kg/m.  General Appearance: Casual  Eye Contact:  Good  Speech:  Normal Rate  Volume:  Normal  Mood:  Anxious  Affect:  Congruent  Thought Process:  Coherent and Descriptions of Associations: Intact  Orientation:  Full (Time, Place, and Person)  Thought Content:  WDL and Logical  Suicidal Thoughts:  No  Homicidal Thoughts:  No  Memory:  Immediate;   Good Recent;   Fair Remote;   Good  Judgement:  Fair  Insight:  Fair  Psychomotor Activity:  Normal  Concentration:  Concentration: Good and Attention Span: Good  Recall:  Good  Fund of Knowledge:  Good  Language:  Good  Akathisia:  No  Handed:  Right  AIMS (if indicated):     Assets:  Housing Leisure Time Physical Health Resilience Social Support Vocational/Educational  ADL's:  Intact  Cognition:  WNL  Sleep:         Physical Exam: Physical Exam Vitals and nursing note  reviewed.  Constitutional:      Appearance: Normal appearance.  HENT:     Head: Normocephalic.  Musculoskeletal:        General: Normal range of motion.     Cervical back: Normal range of motion.  Neurological:     General: No focal deficit present.     Mental Status: She is alert and oriented to person, place,  and time.  Psychiatric:        Attention and Perception: Attention and perception normal.        Mood and Affect: Mood is anxious.        Speech: Speech normal.        Behavior: Behavior normal. Behavior is cooperative.        Thought Content: Thought content normal.        Cognition and Memory: Cognition and memory normal.        Judgment: Judgment normal.   Review of Systems  Psychiatric/Behavioral:  Positive for substance abuse. The patient is nervous/anxious.   All other systems reviewed and are negative. Blood pressure 111/87, pulse 65, temperature 98.4 F (36.9 C), temperature source Oral, resp. rate 18, height 5\' 1"  (1.549 m), weight 38.6 kg, last menstrual period 10/18/2015, SpO2 98 %. Body mass index is 16.06 kg/m.   Demographic Factors:  Living alone  Loss Factors: NA  Historical Factors: NA  Risk Reduction Factors:   Sense of responsibility to family, Employed, and Positive social support  Continued Clinical Symptoms:  Anxiety, mild  Cognitive Features That Contribute To Risk:  None    Suicide Risk:  Minimal: No identifiable suicidal ideation.  Patients presenting with no risk factors but with morbid ruminations; may be classified as minimal risk based on the severity of the depressive symptoms    Plan Of Care/Follow-up recommendations:  Stimulant use disorder: -Refrain from alcohol and drug use -Attend 12-step program with a sponsor -[facility] for substance abuse assistance Activity:  as tolerated Diet:  heart healthy diet   Disposition: discharge home Waylan Boga, NP 04/19/2021, 12:58 PM

## 2021-05-16 ENCOUNTER — Ambulatory Visit: Admission: RE | Admit: 2021-05-16 | Payer: Medicaid Other | Source: Ambulatory Visit

## 2021-05-27 ENCOUNTER — Ambulatory Visit: Payer: Medicaid Other | Admitting: Gastroenterology

## 2021-05-27 ENCOUNTER — Other Ambulatory Visit: Payer: Medicaid Other | Admitting: Gastroenterology

## 2021-06-02 ENCOUNTER — Ambulatory Visit: Payer: Medicaid Other

## 2021-06-04 ENCOUNTER — Ambulatory Visit: Payer: Medicaid Other | Admitting: Gastroenterology

## 2021-08-19 ENCOUNTER — Encounter: Payer: Self-pay | Admitting: Certified Nurse Midwife

## 2021-08-19 ENCOUNTER — Ambulatory Visit: Payer: Medicaid Other | Admitting: Certified Nurse Midwife

## 2021-08-19 ENCOUNTER — Other Ambulatory Visit (HOSPITAL_COMMUNITY)
Admission: RE | Admit: 2021-08-19 | Discharge: 2021-08-19 | Disposition: A | Discharge status: Home / Self Care | Payer: Medicaid Other | Source: Ambulatory Visit | Attending: Certified Nurse Midwife | Admitting: Certified Nurse Midwife

## 2021-08-19 ENCOUNTER — Other Ambulatory Visit: Payer: Self-pay

## 2021-08-19 VITALS — BP 119/82 | HR 74 | Ht 61.0 in | Wt 104.9 lb

## 2021-08-19 DIAGNOSIS — N898 Other specified noninflammatory disorders of vagina: Secondary | ICD-10-CM | POA: Insufficient documentation

## 2021-08-19 DIAGNOSIS — R102 Pelvic and perineal pain: Secondary | ICD-10-CM

## 2021-08-19 NOTE — Progress Notes (Signed)
GYN ENCOUNTER NOTE  Subjective:       Amy Waller is a 60 y.o. G75P0020 female is here for gynecologic evaluation of the following issues:  1. Vaginal discharge 2 requesting u/s for hx "cyst" pt state she has a cyst in the lining of her uterus..     Gynecologic History Patient's last menstrual period was 10/18/2015. Contraception: post menopausal status Last Pap: 2017. Results were: normal per pt Last mammogram: ordered.   Obstetric History OB History  Gravida Para Term Preterm AB Living  4 2     2     SAB IAB Ectopic Multiple Live Births  1            # Outcome Date GA Lbr Len/2nd Weight Sex Delivery Anes PTL Lv  4 AB           3 SAB           2 Para           1 Para             Obstetric Comments  1st Menstrual Cycle:  14   1st Pregnancy: 25     Past Medical History:  Diagnosis Date   Arthritis    Chronic constipation    Dog bite 11-24-15   right thigh   Headache    Hypertension    Trichomonal vaginitis    Vaginal discharge     Past Surgical History:  Procedure Laterality Date   CESAREAN SECTION  1987. 1998    Current Outpatient Medications on File Prior to Visit  Medication Sig Dispense Refill   atenolol (TENORMIN) 50 MG tablet Take 1 tablet (50 mg total) by mouth daily. 30 tablet 5   No current facility-administered medications on file prior to visit.    No Known Allergies  Social History   Socioeconomic History   Marital status: Single    Spouse name: Not on file   Number of children: Not on file   Years of education: Not on file   Highest education level: Not on file  Occupational History   Not on file  Tobacco Use   Smoking status: Every Day    Packs/day: 0.25    Years: 10.00    Pack years: 2.50    Types: Cigarettes   Smokeless tobacco: Never  Vaping Use   Vaping Use: Never used  Substance and Sexual Activity   Alcohol use: Yes    Comment: occasionally   Drug use: Not Currently    Types: Cocaine   Sexual activity: Yes     Birth control/protection: None  Other Topics Concern   Not on file  Social History Narrative   Not on file   Social Determinants of Health   Financial Resource Strain: Not on file  Food Insecurity: Not on file  Transportation Needs: Not on file  Physical Activity: Not on file  Stress: Not on file  Social Connections: Not on file  Intimate Partner Violence: Not on file    Family History  Problem Relation Age of Onset   Cancer Mother 5       ovarian   Hypertension Mother    Ovarian cancer Mother    Cancer Sister 33       throat   Hypertension Sister    Cancer Maternal Uncle        throat   Hypertension Maternal Uncle    Breast cancer Maternal Aunt    Colon cancer Neg Hx  The following portions of the patient's history were reviewed and updated as appropriate: allergies, current medications, past family history, past medical history, past social history, past surgical history and problem list.  Review of Systems Review of Systems - Negative except as noted in HPI Review of Systems - General ROS: negative for - chills, fatigue, fever, hot flashes, malaise or night sweats Hematological and Lymphatic ROS: negative for - bleeding problems or swollen lymph nodes Gastrointestinal ROS: negative for - abdominal pain, blood in stools, change in bowel habits and nausea/vomiting Musculoskeletal ROS: negative for - joint pain, muscle pain or muscular weakness Genito-Urinary ROS: negative for - change in menstrual cycle, dysmenorrhea, dyspareunia, dysuria, genital ulcers, hematuria, incontinence, irregular/heavy menses, nocturia or pelvic pain. Positive for vaginal discharge.   Objective:   BP 119/82   Pulse 74   Ht 5\' 1"  (1.549 m)   Wt 104 lb 14.4 oz (47.6 kg)   LMP 10/18/2015   BMI 19.82 kg/m  CONSTITUTIONAL: Well-developed, well-nourished female in no acute distress.  HENT:  Normocephalic, atraumatic.  NECK: Normal range of motion, supple, no masses.  Normal thyroid.   SKIN: Skin is warm and dry. No rash noted. Not diaphoretic. No erythema. No pallor. [place]: Alert and oriented to person, place, and time. PSYCHIATRIC: Normal mood and affect. Normal behavior. Normal judgment and thought content. CARDIOVASCULAR:Not Examined RESPIRATORY: Not Examined BREASTS: Not Examined ABDOMEN: Soft, non distended; Non tender.  No Organomegaly. PELVIC:  External Genitalia: Normal  BUS: Normal  Vagina: Normal  Cervix: Normal, white particulate discharge noted,no odor  MUSCULOSKELETAL: Normal range of motion. No tenderness.  No cyanosis, clubbing, or edema.     Assessment:   Vaginal discharge   Plan:   Vaginal swab collected. Discussed postmenopausal changes to vaginal tissue. Encourage use of Ph balancer ie boric acid or Replens. She verbalizes and agrees. Pt request follow up u/s for evaluation of "cyst" in her uterine lining. Reviewed last u/s 07/2018, discussed results and noted to her that no cyst was seen but due the anteflexed position of the uterus and previous c-section scar makes it difficult to fully evaluate. Orders placed.   Philip Aspen, CNM

## 2021-08-21 ENCOUNTER — Other Ambulatory Visit: Payer: Self-pay | Admitting: Certified Nurse Midwife

## 2021-08-21 LAB — CERVICOVAGINAL ANCILLARY ONLY
Bacterial Vaginitis (gardnerella): POSITIVE — AB
Candida Glabrata: NEGATIVE
Candida Vaginitis: NEGATIVE
Comment: NEGATIVE
Comment: NEGATIVE
Comment: NEGATIVE

## 2021-08-21 MED ORDER — METRONIDAZOLE 500 MG PO TABS: 500.0000 mg | ORAL_TABLET | Freq: Two times a day (BID) | ORAL | 0 refills | Status: AC

## 2021-08-21 NOTE — Progress Notes (Signed)
Pt.notified

## 2021-09-03 ENCOUNTER — Other Ambulatory Visit: Payer: Medicaid Other

## 2021-09-03 ENCOUNTER — Emergency Department: Payer: Medicaid Other

## 2021-09-03 ENCOUNTER — Other Ambulatory Visit: Payer: Self-pay

## 2021-09-03 ENCOUNTER — Emergency Department
Admission: EM | Admit: 2021-09-03 | Discharge: 2021-09-03 | Disposition: A | Discharge status: Home / Self Care | Payer: Medicaid Other | Attending: Emergency Medicine | Admitting: Emergency Medicine

## 2021-09-03 DIAGNOSIS — S8002XA Contusion of left knee, initial encounter: Secondary | ICD-10-CM | POA: Insufficient documentation

## 2021-09-03 DIAGNOSIS — I10 Essential (primary) hypertension: Secondary | ICD-10-CM | POA: Diagnosis not present

## 2021-09-03 DIAGNOSIS — S8992XA Unspecified injury of left lower leg, initial encounter: Secondary | ICD-10-CM | POA: Diagnosis present

## 2021-09-03 DIAGNOSIS — M542 Cervicalgia: Secondary | ICD-10-CM

## 2021-09-03 DIAGNOSIS — M545 Low back pain, unspecified: Secondary | ICD-10-CM | POA: Diagnosis not present

## 2021-09-03 DIAGNOSIS — R300 Dysuria: Secondary | ICD-10-CM | POA: Insufficient documentation

## 2021-09-03 DIAGNOSIS — F17210 Nicotine dependence, cigarettes, uncomplicated: Secondary | ICD-10-CM | POA: Diagnosis not present

## 2021-09-03 DIAGNOSIS — Y92410 Unspecified street and highway as the place of occurrence of the external cause: Secondary | ICD-10-CM | POA: Diagnosis not present

## 2021-09-03 DIAGNOSIS — S8000XA Contusion of unspecified knee, initial encounter: Secondary | ICD-10-CM

## 2021-09-03 DIAGNOSIS — S8001XA Contusion of right knee, initial encounter: Secondary | ICD-10-CM | POA: Diagnosis not present

## 2021-09-03 DIAGNOSIS — M25512 Pain in left shoulder: Secondary | ICD-10-CM | POA: Insufficient documentation

## 2021-09-03 LAB — URINALYSIS, COMPLETE (UACMP) WITH MICROSCOPIC
Bacteria, UA: NONE SEEN
Bilirubin Urine: NEGATIVE
Glucose, UA: NEGATIVE mg/dL
Ketones, ur: NEGATIVE mg/dL
Leukocytes,Ua: NEGATIVE
Nitrite: NEGATIVE
Protein, ur: NEGATIVE mg/dL
Specific Gravity, Urine: 1.009 (ref 1.005–1.030)
pH: 6 (ref 5.0–8.0)

## 2021-09-03 MED ORDER — ACETAMINOPHEN 500 MG PO TABS
ORAL_TABLET | ORAL | Status: AC
  Filled 2021-09-03: qty 1

## 2021-09-03 MED ORDER — ACETAMINOPHEN 500 MG PO TABS
1000.0000 mg | ORAL_TABLET | Freq: Once | ORAL | Status: AC
  Administered 2021-09-03: 1000 mg via ORAL
  Filled 2021-09-03: qty 2

## 2021-09-03 MED ORDER — IBUPROFEN 400 MG PO TABS
400.0000 mg | ORAL_TABLET | Freq: Once | ORAL | Status: AC
  Administered 2021-09-03: 400 mg via ORAL
  Filled 2021-09-03: qty 1

## 2021-09-03 NOTE — ED Notes (Addendum)
Pt discharged by this RN, pt falling asleep during discharge assessment. Pt states she took some nyquil and has a ride home, per pt her husband will drive her home. This RN confirmed pt called husband to assure safe transportation. Pt sat out in lobby and report given to first nurse.

## 2021-09-03 NOTE — ED Notes (Signed)
Pt taken to ct and xray
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
## 2022-01-30 IMAGING — CR DG SHOULDER 2+V*R*
1 series · 3 of 3 positions shown · non-contrast
Comparison: None.

CLINICAL DATA: Right shoulder injury

EXAM:
RIGHT SHOULDER - 2+ VIEW

[Series 1: dg shoulder right · 0.14mm/px · 3 of 3 slices shown]
[im 1/3]
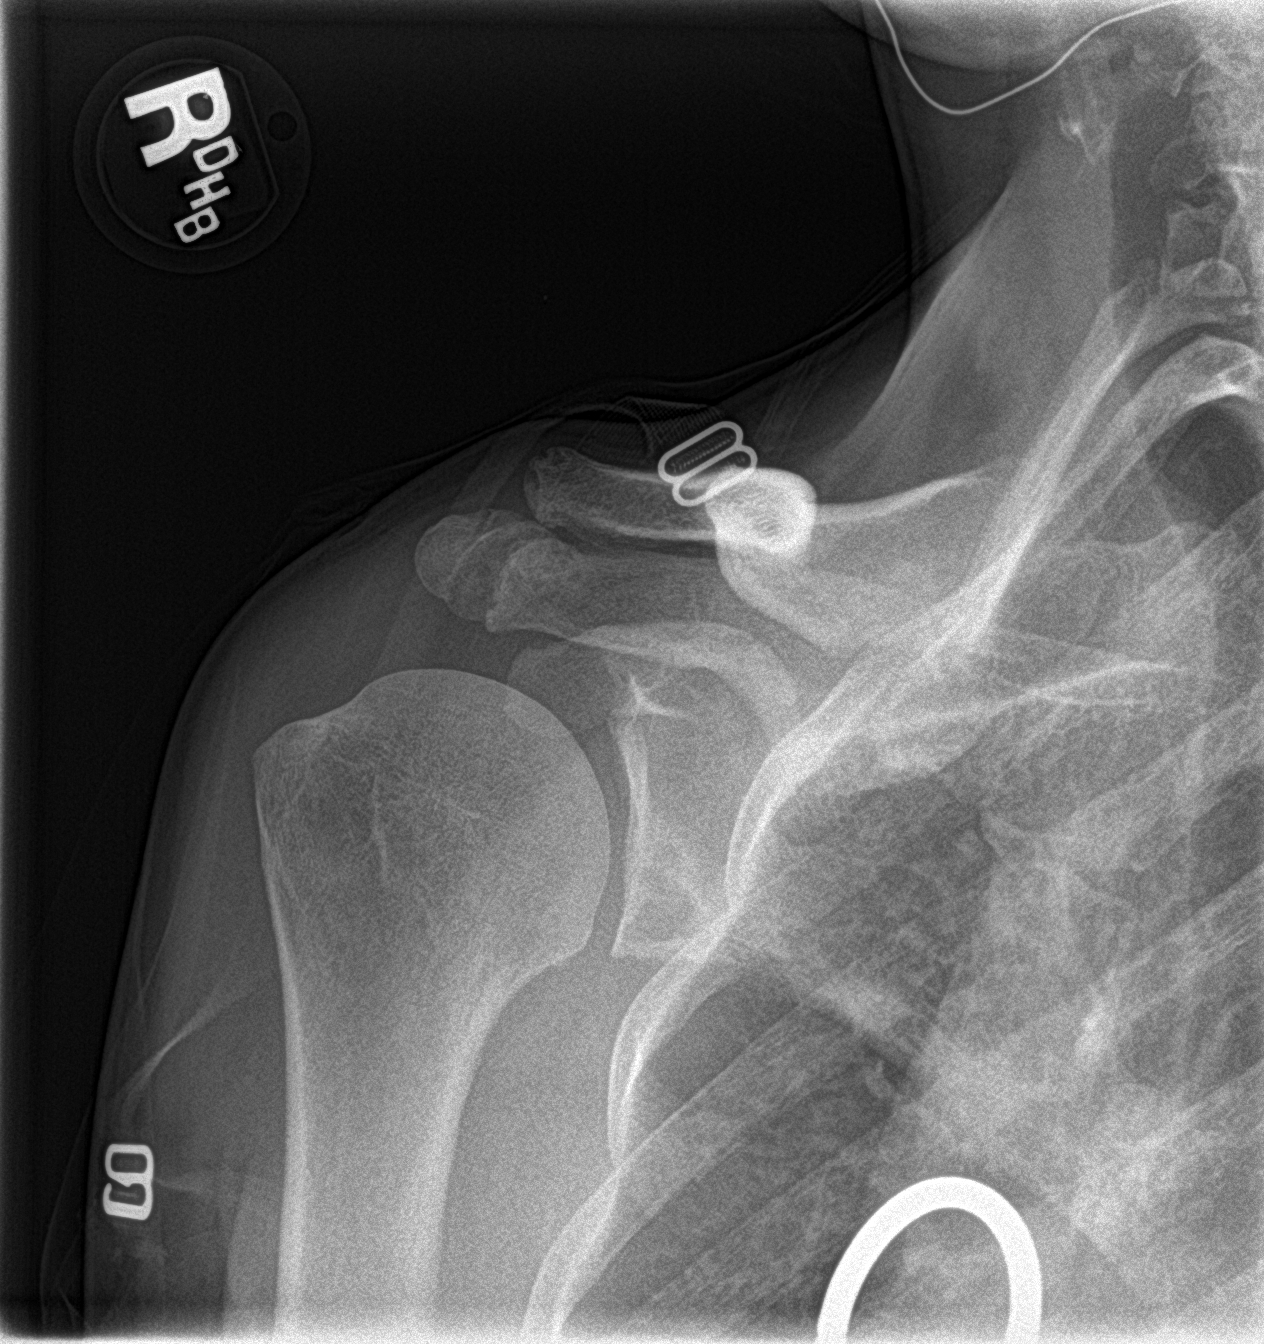
[im 2/3]
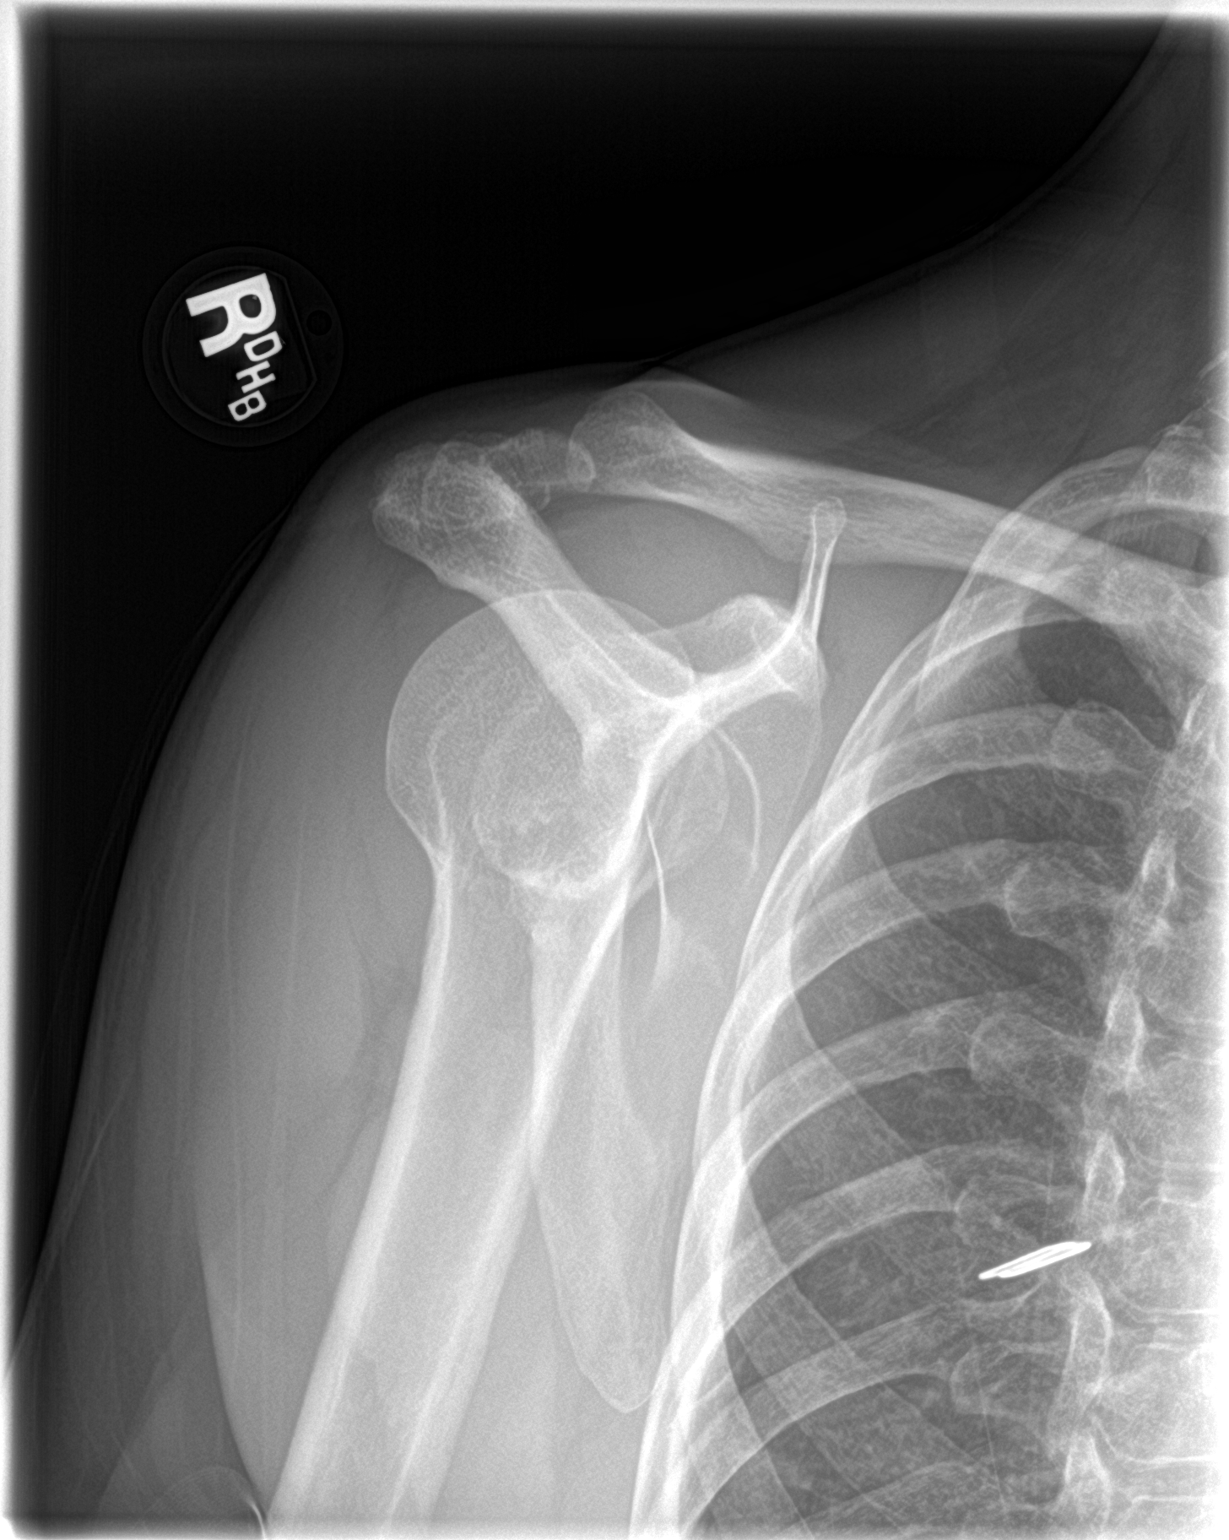
[im 3/3]
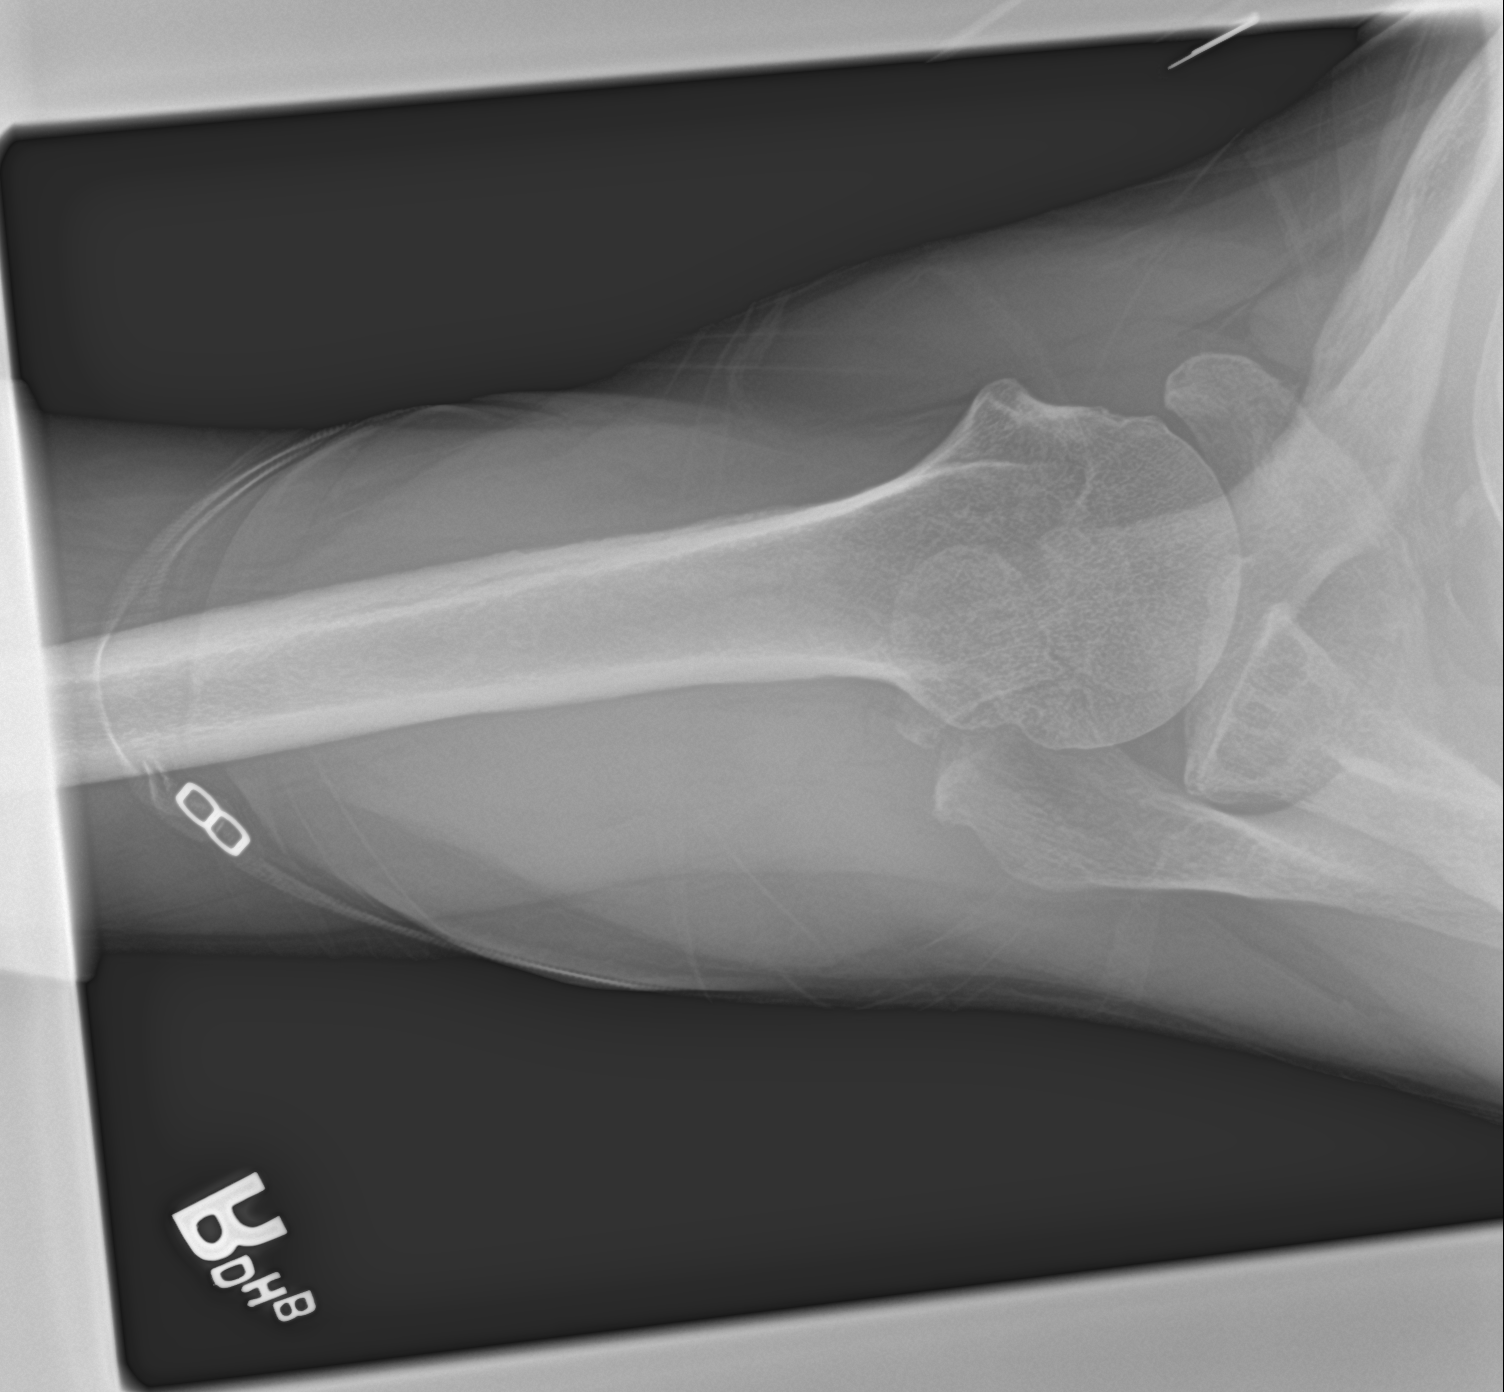

[3 of 3 positions shown; findings below may reference images not displayed]

FINDINGS: No acute displaced fracture or malalignment. Os acromiale. Right
apex is clear.
IMPRESSION: No acute osseous abnormality

## 2022-02-27 ENCOUNTER — Other Ambulatory Visit: Payer: Medicaid Other

## 2022-02-27 ENCOUNTER — Ambulatory Visit: Payer: Medicaid Other

## 2022-03-25 ENCOUNTER — Encounter: Payer: Self-pay | Admitting: Emergency Medicine

## 2022-03-25 ENCOUNTER — Emergency Department
Admission: EM | Admit: 2022-03-25 | Discharge: 2022-03-25 | Discharge status: Against Medical Advice | Payer: Medicaid Other | Attending: Emergency Medicine | Admitting: Emergency Medicine

## 2022-03-25 ENCOUNTER — Other Ambulatory Visit: Payer: Self-pay

## 2022-03-25 DIAGNOSIS — Y92410 Unspecified street and highway as the place of occurrence of the external cause: Secondary | ICD-10-CM | POA: Diagnosis not present

## 2022-03-25 DIAGNOSIS — Z5321 Procedure and treatment not carried out due to patient leaving prior to being seen by health care provider: Secondary | ICD-10-CM | POA: Diagnosis not present

## 2022-03-25 DIAGNOSIS — M549 Dorsalgia, unspecified: Secondary | ICD-10-CM | POA: Diagnosis not present
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
## 2022-04-10 ENCOUNTER — Other Ambulatory Visit: Payer: Self-pay

## 2022-04-10 ENCOUNTER — Encounter: Payer: Self-pay | Admitting: Emergency Medicine

## 2022-04-10 ENCOUNTER — Emergency Department: Payer: Medicaid Other

## 2022-04-10 ENCOUNTER — Emergency Department
Admission: EM | Admit: 2022-04-10 | Discharge: 2022-04-10 | Disposition: A | Discharge status: Home / Self Care | Payer: Medicaid Other | Attending: Student in an Organized Health Care Education/Training Program | Admitting: Student in an Organized Health Care Education/Training Program

## 2022-04-10 DIAGNOSIS — Y92410 Unspecified street and highway as the place of occurrence of the external cause: Secondary | ICD-10-CM | POA: Diagnosis not present

## 2022-04-10 DIAGNOSIS — S161XXA Strain of muscle, fascia and tendon at neck level, initial encounter: Secondary | ICD-10-CM | POA: Insufficient documentation

## 2022-04-10 DIAGNOSIS — S199XXA Unspecified injury of neck, initial encounter: Secondary | ICD-10-CM | POA: Diagnosis present

## 2022-04-10 MED ORDER — MELOXICAM 15 MG PO TABS: 15.0000 mg | ORAL_TABLET | Freq: Every day | ORAL | 0 refills | Status: DC

## 2022-04-10 MED ORDER — ATENOLOL 50 MG PO TABS: 50.0000 mg | ORAL_TABLET | Freq: Every day | ORAL | 5 refills | Status: DC

## 2022-04-10 MED ORDER — METHOCARBAMOL 500 MG PO TABS: 500.0000 mg | ORAL_TABLET | Freq: Four times a day (QID) | ORAL | 0 refills | Status: DC
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
## 2022-12-14 ENCOUNTER — Ambulatory Visit: Payer: Medicaid Other | Admitting: Family Medicine

## 2023-01-05 ENCOUNTER — Encounter: Payer: Self-pay | Admitting: Emergency Medicine

## 2023-01-05 ENCOUNTER — Emergency Department (EMERGENCY_DEPARTMENT_HOSPITAL)
Admission: EM | Admit: 2023-01-05 | Discharge: 2023-01-06 | Disposition: A | Discharge status: Other Acute Inpatient Hospital | Payer: BLUE CROSS/BLUE SHIELD | Source: Home / Self Care | Attending: Emergency Medicine | Admitting: Emergency Medicine

## 2023-01-05 ENCOUNTER — Other Ambulatory Visit: Payer: Self-pay

## 2023-01-05 DIAGNOSIS — F14150 Cocaine abuse with cocaine-induced psychotic disorder with delusions: Secondary | ICD-10-CM | POA: Diagnosis not present

## 2023-01-05 DIAGNOSIS — F29 Unspecified psychosis not due to a substance or known physiological condition: Secondary | ICD-10-CM | POA: Diagnosis not present

## 2023-01-05 DIAGNOSIS — F23 Brief psychotic disorder: Secondary | ICD-10-CM | POA: Insufficient documentation

## 2023-01-05 DIAGNOSIS — R4689 Other symptoms and signs involving appearance and behavior: Secondary | ICD-10-CM

## 2023-01-05 DIAGNOSIS — F17210 Nicotine dependence, cigarettes, uncomplicated: Secondary | ICD-10-CM | POA: Insufficient documentation

## 2023-01-05 DIAGNOSIS — F1410 Cocaine abuse, uncomplicated: Secondary | ICD-10-CM | POA: Insufficient documentation

## 2023-01-05 DIAGNOSIS — Z1152 Encounter for screening for COVID-19: Secondary | ICD-10-CM | POA: Insufficient documentation

## 2023-01-05 DIAGNOSIS — I10 Essential (primary) hypertension: Secondary | ICD-10-CM | POA: Insufficient documentation

## 2023-01-05 LAB — URINE DRUG SCREEN, QUALITATIVE (ARMC ONLY)
Amphetamines, Ur Screen: NOT DETECTED
Barbiturates, Ur Screen: NOT DETECTED
Benzodiazepine, Ur Scrn: NOT DETECTED
Cannabinoid 50 Ng, Ur ~~LOC~~: NOT DETECTED
Cocaine Metabolite,Ur ~~LOC~~: POSITIVE — AB
MDMA (Ecstasy)Ur Screen: NOT DETECTED
Methadone Scn, Ur: NOT DETECTED
Opiate, Ur Screen: NOT DETECTED
Phencyclidine (PCP) Ur S: NOT DETECTED
Tricyclic, Ur Screen: NOT DETECTED

## 2023-01-05 LAB — PREGNANCY, URINE: Preg Test, Ur: NEGATIVE

## 2023-01-05 LAB — RESP PANEL BY RT-PCR (RSV, FLU A&B, COVID)  RVPGX2
Influenza A by PCR: NEGATIVE
Influenza B by PCR: NEGATIVE
Resp Syncytial Virus by PCR: NEGATIVE
SARS Coronavirus 2 by RT PCR: NEGATIVE

## 2023-01-05 LAB — CBC
HCT: 41.2 % (ref 36.0–46.0)
Hemoglobin: 13.5 g/dL (ref 12.0–15.0)
MCH: 30.1 pg (ref 26.0–34.0)
MCHC: 32.8 g/dL (ref 30.0–36.0)
MCV: 91.8 fL (ref 80.0–100.0)
Platelets: 260 10*3/uL (ref 150–400)
RBC: 4.49 MIL/uL (ref 3.87–5.11)
RDW: 14.3 % (ref 11.5–15.5)
WBC: 4.1 10*3/uL (ref 4.0–10.5)
nRBC: 0 % (ref 0.0–0.2)

## 2023-01-05 LAB — COMPREHENSIVE METABOLIC PANEL
ALT: 22 U/L (ref 0–44)
AST: 22 U/L (ref 15–41)
Albumin: 3.9 g/dL (ref 3.5–5.0)
Alkaline Phosphatase: 63 U/L (ref 38–126)
Anion gap: 11 (ref 5–15)
BUN: 11 mg/dL (ref 6–20)
CO2: 22 mmol/L (ref 22–32)
Calcium: 8.8 mg/dL — ABNORMAL LOW (ref 8.9–10.3)
Chloride: 109 mmol/L (ref 98–111)
Creatinine, Ser: 0.65 mg/dL (ref 0.44–1.00)
GFR, Estimated: >60 mL/min (ref >60)
Glucose, Bld: 75 mg/dL (ref 70–99)
Potassium: 3.1 mmol/L — ABNORMAL LOW (ref 3.5–5.1)
Sodium: 142 mmol/L (ref 135–145)
Total Bilirubin: 0.5 mg/dL (ref 0.3–1.2)
Total Protein: 6.8 g/dL (ref 6.5–8.1)

## 2023-01-05 LAB — ETHANOL: Alcohol, Ethyl (B): 10 mg/dL — ABNORMAL HIGH (ref <10)

## 2023-01-05 LAB — SALICYLATE LEVEL: Salicylate Lvl: <7 mg/dL — ABNORMAL LOW (ref 7.0–30.0)

## 2023-01-05 LAB — ACETAMINOPHEN LEVEL: Acetaminophen (Tylenol), Serum: <10 ug/mL — ABNORMAL LOW (ref 10–30)

## 2023-01-05 MED ORDER — ACETAMINOPHEN 325 MG PO TABS
650.0000 mg | ORAL_TABLET | Freq: Once | ORAL | Status: AC
  Administered 2023-01-05: 650 mg via ORAL
  Filled 2023-01-05: qty 2

## 2023-01-05 MED ORDER — POTASSIUM CHLORIDE CRYS ER 20 MEQ PO TBCR
40.0000 meq | EXTENDED_RELEASE_TABLET | Freq: Once | ORAL | Status: AC
  Administered 2023-01-05: 40 meq via ORAL
  Filled 2023-01-05: qty 2

## 2023-01-05 NOTE — ED Notes (Signed)
INVOLUNTARY with all papers on chart/awaiting TTS/PSYCH consult ?

## 2023-01-05 NOTE — ED Notes (Signed)
EDP McHugh to bedside.
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
## 2023-01-05 NOTE — ED Notes (Signed)
Pt hardly wakes to voice; explained will be taking blood from pt; pt gave head nod up and down; pt pulled hand back at stimulation of poke at L hand; pt reassured by voice and relaxed again.
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
## 2023-01-05 NOTE — ED Notes (Signed)
Attempted for blood work with no success.

## 2023-01-05 NOTE — ED Notes (Signed)
Was under impression blood was taken during pt's triage but no gauze on pt's arms and pt denies being poked for blood earlier. Will collect asap; pt hard stick.
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
## 2023-01-05 NOTE — ED Notes (Addendum)
Patient's belongings:  Cell phone Power cords Tablet Cigarettes Lighter Investment banker, operational- locked up with security  2 gray shoes 4 socks 1 Shirin Echeverry/blue sweatshirt 1 gray pants 3 gold colored rings 1 silver colored ring 2 gold colored bracelet  1 hair tie 1 gray shirt 1 brown belt 1 gray tank top 1 gray shorts 1 silver colored anklet 1 pink underwear 1 bonnet
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
## 2023-01-06 ENCOUNTER — Inpatient Hospital Stay
Admission: AD | Admit: 2023-01-06 | Discharge: 2023-01-08 | DRG: 897 | Disposition: A | Discharge status: Home / Self Care | Payer: BLUE CROSS/BLUE SHIELD | Source: Intra-hospital | Attending: Psychiatry | Admitting: Psychiatry

## 2023-01-06 DIAGNOSIS — I10 Essential (primary) hypertension: Secondary | ICD-10-CM | POA: Insufficient documentation

## 2023-01-06 DIAGNOSIS — F14250 Cocaine dependence with cocaine-induced psychotic disorder with delusions: Secondary | ICD-10-CM | POA: Diagnosis not present

## 2023-01-06 DIAGNOSIS — Z1152 Encounter for screening for COVID-19: Secondary | ICD-10-CM

## 2023-01-06 DIAGNOSIS — Z8249 Family history of ischemic heart disease and other diseases of the circulatory system: Secondary | ICD-10-CM

## 2023-01-06 DIAGNOSIS — N898 Other specified noninflammatory disorders of vagina: Secondary | ICD-10-CM | POA: Diagnosis present

## 2023-01-06 DIAGNOSIS — R4689 Other symptoms and signs involving appearance and behavior: Secondary | ICD-10-CM

## 2023-01-06 DIAGNOSIS — F29 Unspecified psychosis not due to a substance or known physiological condition: Secondary | ICD-10-CM | POA: Diagnosis present

## 2023-01-06 DIAGNOSIS — F250 Schizoaffective disorder, bipolar type: Secondary | ICD-10-CM | POA: Diagnosis present

## 2023-01-06 DIAGNOSIS — B3731 Acute candidiasis of vulva and vagina: Secondary | ICD-10-CM | POA: Diagnosis present

## 2023-01-06 DIAGNOSIS — F17210 Nicotine dependence, cigarettes, uncomplicated: Secondary | ICD-10-CM | POA: Diagnosis present

## 2023-01-06 DIAGNOSIS — F14150 Cocaine abuse with cocaine-induced psychotic disorder with delusions: Principal | ICD-10-CM | POA: Diagnosis present

## 2023-01-06 MED ORDER — ZIPRASIDONE MESYLATE 20 MG IM SOLR: 20.0000 mg | INTRAMUSCULAR | Status: DC | PRN

## 2023-01-06 MED ORDER — CLOTRIMAZOLE 1 % VA CREA
1.0000 | TOPICAL_CREAM | Freq: Every day | VAGINAL | Status: DC
  Administered 2023-01-07: 1 via VAGINAL
  Filled 2023-01-06 (×2): qty 45

## 2023-01-06 MED ORDER — LORAZEPAM 1 MG PO TABS: 1.0000 mg | ORAL_TABLET | ORAL | Status: DC | PRN

## 2023-01-06 MED ORDER — BENZTROPINE MESYLATE 1 MG PO TABS: 1.0000 mg | ORAL_TABLET | Freq: Four times a day (QID) | ORAL | Status: DC | PRN

## 2023-01-06 MED ORDER — ACETAMINOPHEN 325 MG PO TABS
650.0000 mg | ORAL_TABLET | Freq: Four times a day (QID) | ORAL | Status: DC | PRN
  Administered 2023-01-08: 650 mg via ORAL
  Filled 2023-01-06: qty 2

## 2023-01-06 MED ORDER — ALUM & MAG HYDROXIDE-SIMETH 200-200-20 MG/5ML PO SUSP: 30.0000 mL | ORAL | Status: DC | PRN

## 2023-01-06 MED ORDER — HALOPERIDOL 5 MG PO TABS: 5.0000 mg | ORAL_TABLET | Freq: Four times a day (QID) | ORAL | Status: DC | PRN

## 2023-01-06 MED ORDER — AMLODIPINE BESYLATE 5 MG PO TABS
5.0000 mg | ORAL_TABLET | Freq: Every day | ORAL | Status: DC
  Administered 2023-01-06 – 2023-01-07 (×2): 5 mg via ORAL
  Filled 2023-01-06 (×3): qty 1

## 2023-01-06 MED ORDER — CLOTRIMAZOLE 2 % VA CREA: 1.0000 | TOPICAL_CREAM | Freq: Every day | VAGINAL | Status: DC

## 2023-01-06 MED ORDER — NICOTINE 21 MG/24HR TD PT24
21.0000 mg | MEDICATED_PATCH | Freq: Every day | TRANSDERMAL | Status: DC
  Administered 2023-01-06 – 2023-01-08 (×3): 21 mg via TRANSDERMAL
  Filled 2023-01-06 (×3): qty 1

## 2023-01-06 MED ORDER — RISPERIDONE 1 MG PO TBDP: 2.0000 mg | ORAL_TABLET | Freq: Three times a day (TID) | ORAL | Status: DC | PRN

## 2023-01-06 MED ORDER — MAGNESIUM HYDROXIDE 400 MG/5ML PO SUSP
30.0000 mL | Freq: Every day | ORAL | Status: DC | PRN
  Administered 2023-01-07: 30 mL via ORAL
  Filled 2023-01-06: qty 30

## 2023-01-06 MED ORDER — NICOTINE POLACRILEX 2 MG MT GUM: 2.0000 mg | CHEWING_GUM | OROMUCOSAL | Status: DC | PRN

## 2023-01-06 NOTE — BHH Suicide Risk Assessment (Signed)
[place] INPATIENT:  Family/Significant Other Suicide Prevention Education  Suicide Prevention Education:  Patient Refusal for Family/Significant Other Suicide Prevention Education: The patient Amy Waller has refused to provide written consent for family/significant other to be provided Family/Significant Other Suicide Prevention Education during admission and/or prior to discharge.  Physician notified.  Durenda Hurt 01/06/2023, 1:59 PM
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
## 2023-01-06 NOTE — Group Note (Signed)
Date:  01/06/2023 Time:  10:28 AM  Group Topic/Focus:  Community Meeting    Participation Level:  Did Not Attend  [name] [facility] 01/06/2023, 10:28 AM

## 2023-01-06 NOTE — Progress Notes (Signed)
D- Patient alert and oriented x 4. Affect blunted/mood irritated. Denies SI/ HI/ AVH. Patient denies pain. States "nothing is wrong with me; I just need some rest from my grandchildren, they are driving me crazy". She denies depression or anxiety. She complained of vaginal itching and antifungal cream ordered and applied. Began Amlodipine today as well for BP. A- PRN medications ordered but not requested this shift. Support and encouragement provided.  Routine safety checks conducted every 15 minutes without incident.  Patient informed to notify staff with problems or concerns and verbalizes understanding. R- Patient attended Treatment Team meeting plan. She did not attend Social work group meeting. Patient receptive, calm, and cooperative. She isolates to her room except for meals. Patient contracts for safety and remains safe on the unit at this time.

## 2023-01-06 NOTE — Group Note (Signed)
[place] LCSW Group Therapy Note   Group Date: 01/06/2023 Start Time: 1300 End Time: 1400   Type of Therapy/Topic:  Group Therapy:  Emotion Regulation  Participation Level:  Did Not Attend   Mood:  Description of Group:    The purpose of this group is to assist patients in learning to regulate negative emotions and experience positive emotions. Patients will be guided to discuss ways in which they have been vulnerable to their negative emotions. These vulnerabilities will be juxtaposed with experiences of positive emotions or situations, and patients challenged to use positive emotions to combat negative ones. Special emphasis will be placed on coping with negative emotions in conflict situations, and patients will process healthy conflict resolution skills.  Therapeutic Goals: Patient will identify two positive emotions or experiences to reflect on in order to balance out negative emotions:  Patient will label two or more emotions that they find the most difficult to experience:  Patient will be able to demonstrate positive conflict resolution skills through discussion or role plays:   Summary of Patient Progress:   X    Therapeutic Modalities:   Cognitive Behavioral Therapy Feelings Identification Dialectical Behavioral Therapy   Rozann Lesches, LCSW

## 2023-01-06 NOTE — Group Note (Signed)
Recreation Therapy Group Note   Group Topic:Problem Solving  Group Date: 01/06/2023 Start Time: 1000 End Time: 1055 Facilitators: Vilma Prader, LRT, CTRS Location:  Craft Room  Group Description: Life Boat. Patients were given the scenario that they are on a boat that is about to become shipwrecked, leaving them stranded on an [place]. They are asked to make a list of 15 different items that they want to take with them when they are stranded on the [place]. Patients are asked to rank their items from most important to least important, #1 being the most important and #15 being the least. Patients will work individually for the first round to come up with 15 items and then pair up with a peer(s) to condense their list and come up with one list of 15 items between the two of them. Patients or LRT will read aloud the 15 different items to the group after each round. LRT facilitated post-activity processing to discuss how this activity can be used in daily life post discharge.   Affect/Mood: N/A   Participation Level: Did not attend    Clinical Observations/Individualized Feedback: Amy Waller did not attend group due to resting in her room.  Plan: Continue to engage patient in RT group sessions 2-3x/week.   Vilma Prader, LRT, CTRS 01/06/2023 11:18 AM
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
## 2023-01-06 NOTE — Progress Notes (Signed)
Patient calm and pleasant during assessment denying SI/HI/AVH. Patient observed interacting appropriately with staff and peers on the unit. Patient didn't have any medication scheduled tonight and hasn't requested anything PRN. Pt given education, support, and encouragement to be active in her treatment plan. Pt being monitored Q 15 minutes for safety per unit protocol, remains safe on the unit
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
## 2023-01-06 NOTE — Consult Note (Signed)
[facility] Psychiatry Consult   Reason for Consult:Psychiatric Evaluation (/)    Referring Physician: Dr. Starleen Blue Patient Identification: Amy Waller MRN:  [id number] Principal Diagnosis: <principal problem not specified> Diagnosis:  Active Problems:   * No active hospital problems. *   Total Time spent with patient: 1 hour  Subjective: "I ain't do nothing."   Amy Waller is a 62 y.o. female patient The patient was brought to the emergency department under involuntary commitment status with police intervention due to concerns regarding drug use and psychotic symptoms. According to reports, the patient exhibited paranoid behavior, believing there were individuals in the house who were not present and attempting to use a taser on her children and grandchildren. She also reported not having slept or eaten for the past four days. During assessment, the patient appeared alert and oriented x4, calm, and cooperative, albeit with some racing thoughts. She denied any substance use on the day of admission, although her chart indicates a history of cocaine use with a pending urine drug screen.  The patient acknowledged having an argument with her daughter but denied any further significant events. She tangentially discussed familial issues but did not exhibit delusional thinking, auditory or visual hallucinations, suicidal, homicidal, or self-harm ideations. There were no apparent psychotic or paranoid behaviors observed during evaluation. The patient meets criteria for admission to the inpatient psychiatric unit for further assessment and management.  HPI:   Past Psychiatric History: History reviewed. No pertinent past psychiatric history  Risk to Self:   Risk to Others:   Prior Inpatient Therapy:   Prior Outpatient Therapy:    Past Medical History:  Past Medical History:  Diagnosis Date   Arthritis    Chronic constipation    Dog bite 11-24-15   right thigh   Headache     Hypertension    Trichomonal vaginitis    Vaginal discharge     Past Surgical History:  Procedure Laterality Date   CESAREAN SECTION  40. 1998   Family History:  Family History  Problem Relation Age of Onset   Cancer Mother 19       ovarian   Hypertension Mother    Ovarian cancer Mother    Cancer Sister 63       throat   Hypertension Sister    Cancer Maternal Uncle        throat   Hypertension Maternal Uncle    Breast cancer Maternal Aunt    Colon cancer Neg Hx    Family Psychiatric  History: History reviewed. No pertinent family psychiatric history Social History:  Social History   Substance and Sexual Activity  Alcohol Use Yes   Comment: occasionally     Social History   Substance and Sexual Activity  Drug Use Not Currently   Types: Cocaine    Social History   Socioeconomic History   Marital status: Single    Spouse name: Not on file   Number of children: Not on file   Years of education: Not on file   Highest education level: Not on file  Occupational History   Not on file  Tobacco Use   Smoking status: Every Day    Packs/day: 0.25    Years: 10.00    Additional pack years: 0.00    Total pack years: 2.50    Types: Cigarettes   Smokeless tobacco: Never  Vaping Use   Vaping Use: Never used  Substance and Sexual Activity   Alcohol use: Yes  Comment: occasionally   Drug use: Not Currently    Types: Cocaine   Sexual activity: Yes    Birth control/protection: None  Other Topics Concern   Not on file  Social History Narrative   Not on file   Social Determinants of Health   Financial Resource Strain: Not on file  Food Insecurity: Not on file  Transportation Needs: Not on file  Physical Activity: Not on file  Stress: Not on file  Social Connections: Not on file   Additional Social History:    Allergies:  No Known Allergies  Labs:  Results for orders placed or performed during the hospital encounter of 01/05/23 (from the past 48 hour(s))   Comprehensive metabolic panel     Status: Abnormal   Collection Time: 01/05/23  5:07 PM  Result Value Ref Range   Sodium 142 135 - 145 mmol/L   Potassium 3.1 (L) 3.5 - 5.1 mmol/L   Chloride 109 98 - 111 mmol/L   CO2 22 22 - 32 mmol/L   Glucose, Bld 75 70 - 99 mg/dL    Comment: Glucose reference range applies only to samples taken after fasting for at least 8 hours.   BUN 11 6 - 20 mg/dL   Creatinine, Ser 0.65 0.44 - 1.00 mg/dL   Calcium 8.8 (L) 8.9 - 10.3 mg/dL   Total Protein 6.8 6.5 - 8.1 g/dL   Albumin 3.9 3.5 - 5.0 g/dL   AST 22 15 - 41 U/L   ALT 22 0 - 44 U/L   Alkaline Phosphatase 63 38 - 126 U/L   Total Bilirubin 0.5 0.3 - 1.2 mg/dL   GFR, Estimated >60 >60 mL/min    Comment: (NOTE) Calculated using the CKD-EPI Creatinine Equation (2021)    Anion gap 11 5 - 15    Comment: Performed at [facility], [address] [place], [place] [address]  Ethanol     Status: Abnormal   Collection Time: 01/05/23  5:07 PM  Result Value Ref Range   Alcohol, Ethyl (B) 10 (H) <10 mg/dL    Comment: (NOTE) Lowest detectable limit for serum alcohol is 10 mg/dL.  For medical purposes only. Performed at [facility], [address] [place], [place] [id number]   Salicylate level     Status: Abnormal   Collection Time: 01/05/23  5:07 PM  Result Value Ref Range   Salicylate Lvl [id number] (L) 7.0 - 30.0 mg/dL    Comment: Performed at [facility], [address] [place], [place] [address]  Acetaminophen level     Status: Abnormal   Collection Time: 01/05/23  5:07 PM  Result Value Ref Range   Acetaminophen (Tylenol), Serum <10 (L) 10 - 30 ug/mL    Comment: (NOTE) Therapeutic concentrations vary significantly. A range of 10-30 ug/mL  may be an effective concentration for many patients. However, some  are best treated at concentrations outside of this range. Acetaminophen concentrations >150 ug/mL at 4 hours after ingestion  and >50 ug/mL at 12 hours after  ingestion are often associated with  toxic reactions.  Performed at [facility], [address] [place], [place] [address]   cbc     Status: None   Collection Time: 01/05/23  5:07 PM  Result Value Ref Range   WBC 4.1 4.0 - 10.5 K/uL   RBC 4.49 3.87 - 5.11 MIL/uL   Hemoglobin 13.5 12.0 - 15.0 g/dL   HCT 41.2 36.0 - 46.0 %   MCV 91.8 80.0 -  100.0 fL   MCH 30.1 26.0 - 34.0 pg   MCHC 32.8 30.0 - 36.0 g/dL   RDW 14.3 11.5 - 15.5 %   Platelets 260 150 - 400 K/uL   nRBC 0.0 0.0 - 0.2 %    Comment: Performed at [facility], [address] [place], [place] [address]  Urine Drug Screen, Qualitative     Status: Abnormal   Collection Time: 01/05/23  9:48 PM  Result Value Ref Range   Tricyclic, Ur Screen NONE DETECTED NONE DETECTED   Amphetamines, Ur Screen NONE DETECTED NONE DETECTED   MDMA (Ecstasy)Ur Screen NONE DETECTED NONE DETECTED   Cocaine Metabolite,Ur [place] POSITIVE (A) NONE DETECTED   Opiate, Ur Screen NONE DETECTED NONE DETECTED   Phencyclidine (PCP) Ur S NONE DETECTED NONE DETECTED   Cannabinoid 50 Ng, Ur [place] NONE DETECTED NONE DETECTED   Barbiturates, Ur Screen NONE DETECTED NONE DETECTED   Benzodiazepine, Ur Scrn NONE DETECTED NONE DETECTED   Methadone Scn, Ur NONE DETECTED NONE DETECTED    Comment: (NOTE) Tricyclics + metabolites, urine    Cutoff 1000 ng/mL Amphetamines + metabolites, urine  Cutoff 1000 ng/mL MDMA (Ecstasy), urine              Cutoff 500 ng/mL Cocaine Metabolite, urine          Cutoff 300 ng/mL Opiate + metabolites, urine        Cutoff 300 ng/mL Phencyclidine (PCP), urine         Cutoff 25 ng/mL Cannabinoid, urine                 Cutoff 50 ng/mL Barbiturates + metabolites, urine  Cutoff 200 ng/mL Benzodiazepine, urine              Cutoff 200 ng/mL Methadone, urine                   Cutoff 300 ng/mL  The urine drug screen provides only a preliminary, unconfirmed analytical test result and should not be used for  non-medical purposes. Clinical consideration and professional judgment should be applied to any positive drug screen result due to possible interfering substances. A more specific alternate chemical method must be used in order to obtain a confirmed analytical result. Gas chromatography / mass spectrometry (GC/MS) is the preferred confirm atory method. Performed at [facility], [address] [place], [place] [address]   Pregnancy, urine     Status: None   Collection Time: 01/05/23  9:48 PM  Result Value Ref Range   Preg Test, Ur NEGATIVE NEGATIVE    Comment: Performed at [facility], [address] [place], [place] [address]  Resp panel by RT-PCR (RSV, Flu A&B, Covid) Anterior Nasal Swab     Status: None   Collection Time: 01/05/23 10:53 PM   Specimen: Anterior Nasal Swab  Result Value Ref Range   SARS Coronavirus 2 by RT PCR NEGATIVE NEGATIVE    Comment: (NOTE) SARS-CoV-2 target nucleic acids are NOT DETECTED.  The SARS-CoV-2 RNA is generally detectable in upper respiratory specimens during the acute phase of infection. The lowest concentration of SARS-CoV-2 viral copies this assay can detect is 138 copies/mL. A negative result does not preclude SARS-Cov-2 infection and should not be used as the sole basis for treatment or other patient management decisions. A negative result may occur with  improper specimen collection/handling, submission of specimen other than nasopharyngeal swab, presence of viral mutation(s) within the areas targeted by this assay, and  inadequate number of viral copies(<138 copies/mL). A negative result must be combined with clinical observations, patient history, and epidemiological information. The expected result is Negative.  Fact Sheet for Patients:  EntrepreneurPulse.com.au  Fact Sheet for Healthcare Providers:  IncredibleEmployment.be  This test is no t yet approved or cleared by the  [place] FDA and  has been authorized for detection and/or diagnosis of SARS-CoV-2 by FDA under an Emergency Use Authorization (EUA). This EUA will remain  in effect (meaning this test can be used) for the duration of the COVID-19 declaration under Section 564(b)(1) of the Act, 21 U.S.C.section 360bbb-3(b)(1), unless the authorization is terminated  or revoked sooner.       Influenza A by PCR NEGATIVE NEGATIVE   Influenza B by PCR NEGATIVE NEGATIVE    Comment: (NOTE) The Xpert Xpress SARS-CoV-2/FLU/RSV plus assay is intended as an aid in the diagnosis of influenza from Nasopharyngeal swab specimens and should not be used as a sole basis for treatment. Nasal washings and aspirates are unacceptable for Xpert Xpress SARS-CoV-2/FLU/RSV testing.  Fact Sheet for Patients: EntrepreneurPulse.com.au  Fact Sheet for Healthcare Providers: IncredibleEmployment.be  This test is not yet approved or cleared by the [place] FDA and has been authorized for detection and/or diagnosis of SARS-CoV-2 by FDA under an Emergency Use Authorization (EUA). This EUA will remain in effect (meaning this test can be used) for the duration of the COVID-19 declaration under Section 564(b)(1) of the Act, 21 U.S.C. section 360bbb-3(b)(1), unless the authorization is terminated or revoked.     Resp Syncytial Virus by PCR NEGATIVE NEGATIVE    Comment: (NOTE) Fact Sheet for Patients: EntrepreneurPulse.com.au  Fact Sheet for Healthcare Providers: IncredibleEmployment.be  This test is not yet approved or cleared by the [place] FDA and has been authorized for detection and/or diagnosis of SARS-CoV-2 by FDA under an Emergency Use Authorization (EUA). This EUA will remain in effect (meaning this test can be used) for the duration of the COVID-19 declaration under Section 564(b)(1) of the Act, 21 U.S.C. section 360bbb-3(b)(1),  unless the authorization is terminated or revoked.  Performed at [facility], [address] [place], [place] [address]     No current facility-administered medications for this encounter.   Current Outpatient Medications  Medication Sig Dispense Refill   atenolol (TENORMIN) 50 MG tablet Take 1 tablet (50 mg total) by mouth daily. (Patient not taking: Reported on 01/05/2023) 30 tablet 5   meloxicam (MOBIC) 15 MG tablet Take 1 tablet (15 mg total) by mouth daily. (Patient not taking: Reported on 01/05/2023) 30 tablet 0   methocarbamol (ROBAXIN) 500 MG tablet Take 1 tablet (500 mg total) by mouth 4 (four) times daily. (Patient not taking: Reported on 01/05/2023) 16 tablet 0    Musculoskeletal: Strength & Muscle Tone: within normal limits Gait & Station: normal Patient leans: N/A  Psychiatric Specialty Exam:  Presentation  General Appearance:  Bizarre  Eye Contact: Fair  Speech: Clear and Coherent  Speech Volume: Normal  Handedness: Right   Mood and Affect  Mood: Euthymic  Affect: Congruent   Thought Process  Thought Processes: Disorganized  Descriptions of Associations:Loose  Orientation:Full (Time, Place and Person)  Thought Content:Logical  History of Schizophrenia/Schizoaffective disorder:Yes  Duration of Psychotic Symptoms:Greater than six months  Hallucinations:Hallucinations: Auditory Description of Auditory Hallucinations: None shared  Ideas of Reference:Delusions  Suicidal Thoughts:Suicidal Thoughts: No  Homicidal Thoughts:Homicidal Thoughts: No   Sensorium  Memory: Immediate Fair; Recent Fair; Remote Fair  Judgment: Poor  Insight: Poor  Executive Functions  Concentration: Poor  Attention Span: Poor  Recall: Poor  Fund of Knowledge: Poor  Language: Poor   Psychomotor Activity  Psychomotor Activity: Psychomotor Activity: Normal   Assets  Assets: Armed forces logistics/support/administrative officer; Physical Health; Resilience;  Housing; Catering manager; Intimacy   Sleep  Sleep: Sleep: Good Number of Hours of Sleep: 8   Physical Exam: Physical Exam Vitals and nursing note reviewed.  Constitutional:      Appearance: Normal appearance. She is normal weight.  HENT:     Head: Normocephalic and atraumatic.     Right Ear: External ear normal.     Left Ear: External ear normal.     Nose: Nose normal.  Cardiovascular:     Rate and Rhythm: Normal rate.     Pulses: Normal pulses.  Pulmonary:     Effort: Pulmonary effort is normal.  Musculoskeletal:        General: Normal range of motion.     Cervical back: Normal range of motion and neck supple.  Neurological:     General: No focal deficit present.     Mental Status: She is alert and oriented to person, place, and time. Mental status is at baseline.  Psychiatric:        Mood and Affect: Mood normal.        Behavior: Behavior normal.        Judgment: Judgment normal.    Review of Systems  Psychiatric/Behavioral:  Positive for substance abuse. The patient is nervous/anxious.    Blood pressure (!) 121/93, pulse 78, temperature 98.4 F (36.9 C), temperature source Oral, resp. rate 16, last menstrual period 10/18/2015, SpO2 100 %. There is no height or weight on file to calculate BMI.  Treatment Plan Summary: Plan   Patient does meet criteria for psychiatric inpatient admission  Disposition: Recommend psychiatric Inpatient admission when medically cleared. Supportive therapy provided about ongoing stressors.  Caroline Sauger, NP 01/06/2023 1:29 AM

## 2023-01-06 NOTE — Tx Team (Signed)
Initial Treatment Plan 01/06/2023 2:19 AM Amy Waller [id number]    PATIENT STRESSORS: Medication change or noncompliance   Substance abuse     PATIENT STRENGTHS: Motivation for treatment/growth  Supportive family/friends    PATIENT IDENTIFIED PROBLEMS: Anxiety  Depression                   DISCHARGE CRITERIA:  Improved stabilization in mood, thinking, and/or behavior Verbal commitment to aftercare and medication compliance  PRELIMINARY DISCHARGE PLAN: Outpatient therapy Return to previous living arrangement  PATIENT/FAMILY INVOLVEMENT: This treatment plan has been presented to and reviewed with the patient, Amy Waller. The patient has been given the opportunity to ask questions and make suggestions.  Mallie Darting, RN 01/06/2023, 2:19 AM

## 2023-01-06 NOTE — Plan of Care (Signed)

## 2023-01-06 NOTE — Plan of Care (Signed)

## 2023-01-06 NOTE — H&P (Signed)
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  62 year old woman brought to the hospital in [place] after involuntary commitment papers were filed by her daughter.  The commitment papers states that the patient had been using crack cocaine and had not slept for several days and as a result had been having visual hallucinations and had been behaving in an agitated way including threatening family members with a taser.  On interview today the patient says she believes the papers were filed because her daughter just wants to "get me out of the house".  Patient denies having threatened anyone with her taser although she admits that she does own 1 to protect herself from dogs.  She has no awareness of having had hallucinations.  She claims that she sleeps fine at home.  Patient's drug screen is positive for cocaine and she admits to cocaine use but says that it was just a little bit a few days ago.  Denies other drug use.  Denies alcohol use although the drug screen also was positive for alcohol.  Patient currently denies any hallucinations.  Denies suicidal or homicidal thought.  She has been calm and appropriate with no obvious bizarre behavior since coming to the hospital. Associated Signs/Symptoms: Depression Symptoms:  insomnia, (Hypo) Manic Symptoms:  Impulsivity, Irritable Mood, Labiality of Mood, Anxiety Symptoms:   None reported Psychotic Symptoms:  Hallucinations: Visual Visual  hallucinations have been alleged by family members but the patient denies it and denies any symptoms now. PTSD Symptoms: Negative Total Time spent with patient: 45 minutes  Past Psychiatric History: Patient has a past history of presentation to emergency room's under very similar circumstance also thought to be related to drug abuse as symptoms appeared to clear up very quickly.  She admits that there was a distant history years ago being told she had "manic depression" although she cannot remember or describe any symptoms.  She says that she was prescribed medicine in the past but cannot remember what it was.  Has not been taking it in years.  Denies any history of suicide attempts.  Is the patient at risk to self? No.  Has the patient been a risk to self in the past 6 months? No.  Has the patient been a risk to self within the distant past? No.  Is the patient a risk to others? No.  Has the patient been a risk to others in the past 6 months? Yes.    Has the patient been a risk to others within the distant past? No.   [place] Scale:  [place] [facility] Admission (Current) from 01/06/2023 in [place] ED from 01/05/2023 in [facility] Department at [facility] from 04/10/2022 in [facility] Emergency Department at [place] No Risk No Risk No Risk        Prior Inpatient Therapy: No. If yes, describe none identified Prior Outpatient Therapy: No. If yes, describe says she does not go for outpatient treatment  Alcohol Screening: 1. How often do you have a drink containing alcohol?: 2 to  4 times a month 2. How many drinks containing alcohol do you have on a typical day when you are drinking?: 3 or 4 3. How often do you have six or more drinks on one occasion?: Never AUDIT-C Score: 3 4. How often during the last year have you found that you were not able to stop drinking once you had started?: Never 5. How often during the  last year have you failed to do what was normally expected from you because of drinking?: Never 6. How often during the last year have you needed a first drink in the morning to get yourself going after a heavy drinking session?: Never 7. How often during the last year have you had a feeling of guilt of remorse after drinking?: Never 8. How often during the last year have you been unable to remember what happened the night before because you had been drinking?: Never 9. Have you or someone else been injured as a result of your drinking?: No 10. Has a relative or friend or a doctor or another health worker been concerned about your drinking or suggested you cut down?: No Alcohol Use Disorder Identification Test Final Score (AUDIT): 3 Substance Abuse History in the last 12 months:  Yes.   Consequences of Substance Abuse: Despite the patient's statement it seems that putting the whole story together by far the most likely explanation for this situation is substance-induced psychotic behavior which is indeed basically what is alleged in the paperwork. Previous Psychotropic Medications: Yes  Psychological Evaluations: Yes  Past Medical History:  Past Medical History:  Diagnosis Date   Arthritis    Chronic constipation    Dog bite 11-24-15   right thigh   Headache    Hypertension    Trichomonal vaginitis    Vaginal discharge     Past Surgical History:  Procedure Laterality Date   CESAREAN SECTION  61. 1998   Family History:  Family History  Problem Relation Age of Onset   Cancer Mother 62       ovarian   Hypertension Mother    Ovarian cancer Mother    Cancer Sister 49       throat   Hypertension Sister    Cancer Maternal Uncle        throat   Hypertension Maternal Uncle    Breast cancer Maternal Aunt    Colon cancer Neg Hx    Family Psychiatric  History: Patient says that multiple people in her family have drug problems Tobacco Screening:  Social History   Tobacco Use   Smoking Status Every Day   Packs/day: 0.25   Years: 10.00   Additional pack years: 0.00   Total pack years: 2.50   Types: Cigarettes  Smokeless Tobacco Never    BH Tobacco Counseling     Are you interested in Tobacco Cessation Medications?  No, patient refused Counseled patient on smoking cessation:  Refused/Declined practical counseling Reason Tobacco Screening Not Completed: No value filed.       Social History:  Social History   Substance and Sexual Activity  Alcohol Use Yes   Comment: occasionally     Social History   Substance and Sexual Activity  Drug Use Not Currently   Types: Cocaine    Additional Social History: Marital status: Separated Separated, when?: 2004 Are you sexually active?: No What is your sexual orientation?: Heterosexual  Allergies:  No Known Allergies Lab Results:  Results for orders placed or performed during the hospital encounter of 01/05/23 (from the past 48 hour(s))  Comprehensive metabolic panel     Status: Abnormal   Collection Time: 01/05/23  5:07 PM  Result Value Ref Range   Sodium 142 135 - 145 mmol/L   Potassium 3.1 (L) 3.5 - 5.1 mmol/L   Chloride 109 98 - 111 mmol/L   CO2 22 22 - 32 mmol/L   Glucose, Bld 75 70 - 99 mg/dL    Comment: Glucose reference range applies only to samples taken after fasting for at least 8 hours.   BUN 11 6 - 20 mg/dL   Creatinine, Ser 0.65 0.44 - 1.00 mg/dL   Calcium 8.8 (L) 8.9 - 10.3 mg/dL   Total Protein 6.8 6.5 - 8.1 g/dL   Albumin 3.9 3.5 - 5.0 g/dL   AST 22 15 - 41 U/L   ALT 22 0 - 44 U/L   Alkaline Phosphatase 63 38 - 126 U/L   Total Bilirubin 0.5 0.3 - 1.2 mg/dL   GFR, Estimated >60 >60 mL/min    Comment: (NOTE) Calculated using the CKD-EPI Creatinine Equation (2021)    Anion gap 11 5 - 15    Comment: Performed at [facility], [address] [place], [place] [address]  Ethanol     Status: Abnormal   Collection Time: 01/05/23  5:07 PM   Result Value Ref Range   Alcohol, Ethyl (B) 10 (H) <10 mg/dL    Comment: (NOTE) Lowest detectable limit for serum alcohol is 10 mg/dL.  For medical purposes only. Performed at [facility], [address] [place], [place] [id number]   Salicylate level     Status: Abnormal   Collection Time: 01/05/23  5:07 PM  Result Value Ref Range   Salicylate Lvl [id number] (L) 7.0 - 30.0 mg/dL    Comment: Performed at [facility], [address] [place], [place] [address]  Acetaminophen level     Status: Abnormal   Collection Time: 01/05/23  5:07 PM  Result Value Ref Range   Acetaminophen (Tylenol), Serum <10 (L) 10 - 30 ug/mL    Comment: (NOTE) Therapeutic concentrations vary significantly. A range of 10-30 ug/mL  may be an effective concentration for many patients. However, some  are best treated at concentrations outside of this range. Acetaminophen concentrations >150 ug/mL at 4 hours after ingestion  and >50 ug/mL at 12 hours after ingestion are often associated with  toxic reactions.  Performed at [facility], [address] [place], [place] [address]   cbc     Status: None   Collection Time: 01/05/23  5:07 PM  Result Value Ref Range   WBC 4.1 4.0 - 10.5 K/uL   RBC 4.49 3.87 - 5.11 MIL/uL   Hemoglobin 13.5 12.0 - 15.0 g/dL   HCT 41.2 36.0 - 46.0 %   MCV 91.8 80.0 - 100.0 fL   MCH 30.1 26.0 - 34.0 pg   MCHC 32.8 30.0 - 36.0 g/dL   RDW 14.3 11.5 - 15.5 %   Platelets 260 150 - 400 K/uL   nRBC 0.0 0.0 - 0.2 %    Comment: Performed at [facility], [address], [place], [place] [address]  Urine Drug Screen, Qualitative     Status: Abnormal   Collection Time: 01/05/23  9:48 PM  Result Value Ref Range   Tricyclic, Ur Screen NONE DETECTED NONE DETECTED  Amphetamines, Ur Screen NONE DETECTED NONE DETECTED   MDMA (Ecstasy)Ur Screen NONE DETECTED NONE DETECTED   Cocaine Metabolite,Ur [place] POSITIVE (A) NONE DETECTED   Opiate, Ur Screen NONE  DETECTED NONE DETECTED   Phencyclidine (PCP) Ur S NONE DETECTED NONE DETECTED   Cannabinoid 50 Ng, Ur [place] NONE DETECTED NONE DETECTED   Barbiturates, Ur Screen NONE DETECTED NONE DETECTED   Benzodiazepine, Ur Scrn NONE DETECTED NONE DETECTED   Methadone Scn, Ur NONE DETECTED NONE DETECTED    Comment: (NOTE) Tricyclics + metabolites, urine    Cutoff 1000 ng/mL Amphetamines + metabolites, urine  Cutoff 1000 ng/mL MDMA (Ecstasy), urine              Cutoff 500 ng/mL Cocaine Metabolite, urine          Cutoff 300 ng/mL Opiate + metabolites, urine        Cutoff 300 ng/mL Phencyclidine (PCP), urine         Cutoff 25 ng/mL Cannabinoid, urine                 Cutoff 50 ng/mL Barbiturates + metabolites, urine  Cutoff 200 ng/mL Benzodiazepine, urine              Cutoff 200 ng/mL Methadone, urine                   Cutoff 300 ng/mL  The urine drug screen provides only a preliminary, unconfirmed analytical test result and should not be used for non-medical purposes. Clinical consideration and professional judgment should be applied to any positive drug screen result due to possible interfering substances. A more specific alternate chemical method must be used in order to obtain a confirmed analytical result. Gas chromatography / mass spectrometry (GC/MS) is the preferred confirm atory method. Performed at [facility], [address] [place], [place] [address]   Pregnancy, urine     Status: None   Collection Time: 01/05/23  9:48 PM  Result Value Ref Range   Preg Test, Ur NEGATIVE NEGATIVE    Comment: Performed at [facility], [address] [place],  [address]  Resp panel by RT-PCR (RSV, Flu A&B, Covid) Anterior Nasal Swab     Status: None   Collection Time: 01/05/23 10:53 PM   Specimen: Anterior Nasal Swab  Result Value Ref Range   SARS Coronavirus 2 by RT PCR NEGATIVE NEGATIVE    Comment: (NOTE) SARS-CoV-2 target nucleic acids are NOT DETECTED.  The SARS-CoV-2  RNA is generally detectable in upper respiratory specimens during the acute phase of infection. The lowest concentration of SARS-CoV-2 viral copies this assay can detect is 138 copies/mL. A negative result does not preclude SARS-Cov-2 infection and should not be used as the sole basis for treatment or other patient management decisions. A negative result may occur with  improper specimen collection/handling, submission of specimen other than nasopharyngeal swab, presence of viral mutation(s) within the areas targeted by this assay, and inadequate number of viral copies(<138 copies/mL). A negative result must be combined with clinical observations, patient history, and epidemiological information. The expected result is Negative.  Fact Sheet for Patients:  EntrepreneurPulse.com.au  Fact Sheet for Healthcare Providers:  IncredibleEmployment.be  This test is no t yet approved or cleared by the [place] FDA and  has been authorized for detection and/or diagnosis of SARS-CoV-2 by FDA under an Emergency Use Authorization (EUA). This EUA will remain  in effect (meaning this test can be used) for the duration of the  COVID-19 declaration under Section 564(b)(1) of the Act, 21 U.S.C.section 360bbb-3(b)(1), unless the authorization is terminated  or revoked sooner.       Influenza A by PCR NEGATIVE NEGATIVE   Influenza B by PCR NEGATIVE NEGATIVE    Comment: (NOTE) The Xpert Xpress SARS-CoV-2/FLU/RSV plus assay is intended as an aid in the diagnosis of influenza from Nasopharyngeal swab specimens and should not be used as a sole basis for treatment. Nasal washings and aspirates are unacceptable for Xpert Xpress SARS-CoV-2/FLU/RSV testing.  Fact Sheet for Patients: EntrepreneurPulse.com.au  Fact Sheet for Healthcare Providers: IncredibleEmployment.be  This test is not yet approved or cleared by the [place]  FDA and has been authorized for detection and/or diagnosis of SARS-CoV-2 by FDA under an Emergency Use Authorization (EUA). This EUA will remain in effect (meaning this test can be used) for the duration of the COVID-19 declaration under Section 564(b)(1) of the Act, 21 U.S.C. section 360bbb-3(b)(1), unless the authorization is terminated or revoked.     Resp Syncytial Virus by PCR NEGATIVE NEGATIVE    Comment: (NOTE) Fact Sheet for Patients: EntrepreneurPulse.com.au  Fact Sheet for Healthcare Providers: IncredibleEmployment.be  This test is not yet approved or cleared by the [place] FDA and has been authorized for detection and/or diagnosis of SARS-CoV-2 by FDA under an Emergency Use Authorization (EUA). This EUA will remain in effect (meaning this test can be used) for the duration of the COVID-19 declaration under Section 564(b)(1) of the Act, 21 U.S.C. section 360bbb-3(b)(1), unless the authorization is terminated or revoked.  Performed at [facility], [address] [place], [place] [address]     Blood Alcohol level:  Lab Results  Component Value Date   ETH 10 (H) 01/05/2023   ETH <10 [id number]    Metabolic Disorder Labs:  No results found for: "HGBA1C", "MPG" No results found for: "PROLACTIN" No results found for: "CHOL", "TRIG", "HDL", "CHOLHDL", "VLDL", "LDLCALC"  Current Medications: Current Facility-Administered Medications  Medication Dose Route Frequency Provider Last Rate Last Admin   acetaminophen (TYLENOL) tablet 650 mg  650 mg Oral Q6H PRN Caroline Sauger, NP       alum & mag hydroxide-simeth (MAALOX/MYLANTA) 200-200-20 MG/5ML suspension 30 mL  30 mL Oral Q4H PRN Caroline Sauger, NP       amLODipine (NORVASC) tablet 5 mg  5 mg Oral Daily Kaymon Denomme, Madie Reno, MD       haloperidol (HALDOL) tablet 5 mg  5 mg Oral Q6H PRN Caroline Sauger, NP       And   benztropine (COGENTIN) tablet 1 mg  1  mg Oral Q6H PRN Caroline Sauger, NP       clotrimazole (GYNE-LOTRIMIN) vaginal cream 1 Applicatorful  1 Applicatorful Vaginal QHS Chinita Greenland A, RPH       risperiDONE (RISPERDAL M-TABS) disintegrating tablet 2 mg  2 mg Oral Q8H PRN Caroline Sauger, NP       And   LORazepam (ATIVAN) tablet 1 mg  1 mg Oral PRN Caroline Sauger, NP       And   ziprasidone (GEODON) injection 20 mg  20 mg Intramuscular PRN Caroline Sauger, NP       magnesium hydroxide (MILK OF MAGNESIA) suspension 30 mL  30 mL Oral Daily PRN Caroline Sauger, NP       nicotine (NICODERM CQ - dosed in mg/24 hours) patch 21 mg  21 mg Transdermal Daily Wenceslaus Gist T, MD       nicotine polacrilex (NICORETTE) gum 2 mg  2 mg Oral PRN Laniqua Torrens, Madie Reno, MD       PTA Medications: Medications Prior to Admission  Medication Sig Dispense Refill Last Dose   atenolol (TENORMIN) 50 MG tablet Take 1 tablet (50 mg total) by mouth daily. (Patient not taking: Reported on 01/05/2023) 30 tablet 5    meloxicam (MOBIC) 15 MG tablet Take 1 tablet (15 mg total) by mouth daily. (Patient not taking: Reported on 01/05/2023) 30 tablet 0    methocarbamol (ROBAXIN) 500 MG tablet Take 1 tablet (500 mg total) by mouth 4 (four) times daily. (Patient not taking: Reported on 01/05/2023) 16 tablet 0     Musculoskeletal: Strength & Muscle Tone: within normal limits Gait & Station: normal Patient leans: N/A            Psychiatric Specialty Exam:  Presentation  General Appearance:  Bizarre  Eye Contact: Fair  Speech: Clear and Coherent  Speech Volume: Normal  Handedness: Right   Mood and Affect  Mood: Euthymic  Affect: Congruent   Thought Process  Thought Processes: Disorganized  Duration of Psychotic Symptoms: Seem to have only been present for a few days Past Diagnosis of Schizophrenia or Psychoactive disorder: Yes  Descriptions of Associations:Loose  Orientation:Full (Time, Place and  Person)  Thought Content:Logical  Hallucinations:Hallucinations: Auditory Description of Auditory Hallucinations: None shared  Ideas of Reference:Delusions  Suicidal Thoughts:Suicidal Thoughts: No  Homicidal Thoughts:Homicidal Thoughts: No   Sensorium  Memory: Immediate Fair; Recent Fair; Remote Fair  Judgment: Poor  Insight: Poor   Executive Functions  Concentration: Poor  Attention Span: Poor  Recall: Poor  Fund of Knowledge: Poor  Language: Poor   Psychomotor Activity  Psychomotor Activity: Psychomotor Activity: Normal   Assets  Assets: Communication Skills; Physical Health; Resilience; Housing; Catering manager; Intimacy   Sleep  Sleep: Sleep: Good Number of Hours of Sleep: 8    Physical Exam: Physical Exam Vitals and nursing note reviewed.  Constitutional:      Appearance: Normal appearance.  HENT:     Head: Normocephalic and atraumatic.     Mouth/Throat:     Pharynx: Oropharynx is clear.  Eyes:     Pupils: Pupils are equal, round, and reactive to light.  Cardiovascular:     Rate and Rhythm: Normal rate and regular rhythm.  Pulmonary:     Effort: Pulmonary effort is normal.     Breath sounds: Normal breath sounds.  Abdominal:     General: Abdomen is flat.     Palpations: Abdomen is soft.  Musculoskeletal:        General: Normal range of motion.  Skin:    General: Skin is warm and dry.  Neurological:     General: No focal deficit present.     Mental Status: She is alert. Mental status is at baseline.  Psychiatric:        Attention and Perception: Attention normal.        Mood and Affect: Mood normal.        Speech: Speech normal.        Behavior: Behavior is cooperative.        Thought Content: Thought content normal.        Cognition and Memory: She exhibits impaired recent memory.    Review of Systems  Constitutional: Negative.   HENT: Negative.    Eyes: Negative.   Respiratory: Negative.     Cardiovascular: Negative.   Gastrointestinal: Negative.   Musculoskeletal: Negative.   Skin: Negative.   Neurological: Negative.  Psychiatric/Behavioral: Negative.     Blood pressure (!) 125/95, pulse 89, temperature 98.4 F (36.9 C), temperature source Oral, resp. rate 17, height 5\' 1"  (1.549 m), weight 42.2 kg, last menstrual period 10/18/2015, SpO2 100 %. Body mass index is 17.57 kg/m.  Treatment Plan Summary: Medication management and Plan right now the patient is not displaying any acute symptoms nor does she have any acute complaints that would be a clear rationale to prescribe psychiatric medication.  Patient has been educated and will be further educated that crack cocaine use appears to be very bad for her and that she should discontinue it and that if she needs any help with that substance abuse treatment is available.  Patient is complaining of having a yeast infection and the appropriate prescription is made for that.  She will also be restarted on blood pressure medicine which she says she used to take regularly but has not been on for a long time.  She dislikes lisinopril so we will try amlodipine.  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    Alethia Berthold, MD 3/20/20242:53 PM

## 2023-01-06 NOTE — BH IP Treatment Plan (Signed)
Interdisciplinary Treatment and Diagnostic Plan Update  01/06/2023 Time of Session: 0830 DHRITI SOLOMONS MRN: [id number]  Principal Diagnosis: Psychosis [facility])  Secondary Diagnoses: Principal Problem:   Psychosis (Amy Waller)   Current Medications:  Current Facility-Administered Medications  Medication Dose Route Frequency Provider Last Rate Last Admin   acetaminophen (TYLENOL) tablet 650 mg  650 mg Oral Q6H PRN Caroline Sauger, NP       alum & mag hydroxide-simeth (MAALOX/MYLANTA) 200-200-20 MG/5ML suspension 30 mL  30 mL Oral Q4H PRN Caroline Sauger, NP       haloperidol (HALDOL) tablet 5 mg  5 mg Oral Q6H PRN Caroline Sauger, NP       And   benztropine (COGENTIN) tablet 1 mg  1 mg Oral Q6H PRN Caroline Sauger, NP       risperiDONE (RISPERDAL M-TABS) disintegrating tablet 2 mg  2 mg Oral Q8H PRN Caroline Sauger, NP       And   LORazepam (ATIVAN) tablet 1 mg  1 mg Oral PRN Caroline Sauger, NP       And   ziprasidone (GEODON) injection 20 mg  20 mg Intramuscular PRN Caroline Sauger, NP       magnesium hydroxide (MILK OF MAGNESIA) suspension 30 mL  30 mL Oral Daily PRN Caroline Sauger, NP       PTA Medications: Medications Prior to Admission  Medication Sig Dispense Refill Last Dose   atenolol (TENORMIN) 50 MG tablet Take 1 tablet (50 mg total) by mouth daily. (Patient not taking: Reported on 01/05/2023) 30 tablet 5    meloxicam (MOBIC) 15 MG tablet Take 1 tablet (15 mg total) by mouth daily. (Patient not taking: Reported on 01/05/2023) 30 tablet 0    methocarbamol (ROBAXIN) 500 MG tablet Take 1 tablet (500 mg total) by mouth 4 (four) times daily. (Patient not taking: Reported on 01/05/2023) 16 tablet 0     Patient Stressors: Medication change or noncompliance   Substance abuse    Patient Strengths: Motivation for treatment/growth  Supportive family/friends   Treatment Modalities: Medication Management, Group therapy, Case management,  1 to 1  session with clinician, Psychoeducation, Recreational therapy.   Physician Treatment Plan for Primary Diagnosis: Psychosis ([place]) Long Term Goal(s):     Short Term Goals:    Medication Management: Evaluate patient's response, side effects, and tolerance of medication regimen.  Therapeutic Interventions: 1 to 1 sessions, Unit Group sessions and Medication administration.  Evaluation of Outcomes: Progressing  Physician Treatment Plan for Secondary Diagnosis: Principal Problem:   Psychosis ([place])  Long Term Goal(s):     Short Term Goals:       Medication Management: Evaluate patient's response, side effects, and tolerance of medication regimen.  Therapeutic Interventions: 1 to 1 sessions, Unit Group sessions and Medication administration.  Evaluation of Outcomes: Progressing   RN Treatment Plan for Primary Diagnosis: Psychosis ([place]) Long Term Goal(s): Knowledge of disease and therapeutic regimen to maintain health will improve  Short Term Goals: Ability to remain free from injury will improve, Ability to verbalize frustration and anger appropriately will improve, Ability to demonstrate self-control, Ability to participate in decision making will improve, Ability to verbalize feelings will improve, Ability to disclose and discuss suicidal ideas, Ability to identify and develop effective coping behaviors will improve, and Compliance with prescribed medications will improve  Medication Management: RN will administer medications as ordered by provider, will assess and evaluate patient's response and provide education to patient for prescribed medication. RN will report any adverse and/or side effects  to prescribing provider.  Therapeutic Interventions: 1 on 1 counseling sessions, Psychoeducation, Medication administration, Evaluate responses to treatment, Monitor vital signs and CBGs as ordered, Perform/monitor CIWA, COWS, AIMS and Fall Risk screenings as ordered, Perform wound care  treatments as ordered.  Evaluation of Outcomes: Progressing   LCSW Treatment Plan for Primary Diagnosis: Psychosis ([place]) Long Term Goal(s): Safe transition to appropriate next level of care at discharge, Engage patient in therapeutic group addressing interpersonal concerns.  Short Term Goals: Engage patient in aftercare planning with referrals and resources, Increase social support, Increase ability to appropriately verbalize feelings, Increase emotional regulation, Facilitate acceptance of mental health diagnosis and concerns, Facilitate patient progression through stages of change regarding substance use diagnoses and concerns, Identify triggers associated with mental health/substance abuse issues, and Increase skills for wellness and recovery  Therapeutic Interventions: Assess for all discharge needs, 1 to 1 time with Social worker, Explore available resources and support systems, Assess for adequacy in community support network, Educate family and significant other(s) on suicide prevention, Complete Psychosocial Assessment, Interpersonal group therapy.  Evaluation of Outcomes: Progressing   Progress in Treatment: Attending groups: No. Participating in groups: No. Taking medication as prescribed: Yes. Toleration medication: Yes. Family/Significant other contact made: No, will contact:  CSW to obtain consent  Patient understands diagnosis: No. Discussing patient identified problems/goals with staff: Yes. Medical problems stabilized or resolved: Yes. Denies suicidal/homicidal ideation: No. Issues/concerns per patient self-inventory: Yes. Other: none  New problem(s) identified: No, Describe:  none  New Short Term/Long Term Goal(s): Patient to work towards detox, elimination of symptoms of psychosis, medication management for mood stabilization; elimination of SI thoughts; development of comprehensive mental wellness/sobriety plan.  Patient Goals:  Patient states their goal for  treatment is to "nothing really."  Discharge Plan or Barriers: No psychosocial barriers identified at this time, patient to return to place of residence when appropriate for discharge.   Reason for Continuation of Hospitalization: Medication stabilization Other; describe psychotic feature   Estimated Length of Stay: 1-7 days   Last [facility] 2/9 Scores:    04/07/2021    3:54 PM 04/07/2021    3:25 PM  Depression screen PHQ 2/9  Decreased Interest  2  Down, Depressed, Hopeless  1  PHQ - 2 Score  3  Altered sleeping 3   Tired, decreased energy 3   Change in appetite 0   Feeling bad or failure about yourself  0   Trouble concentrating 1   Moving slowly or fidgety/restless 1   Suicidal thoughts 0     Scribe for Treatment Team: Larose Kells 01/06/2023 10:39 AM

## 2023-01-06 NOTE — Group Note (Signed)
Date:  01/06/2023 Time:  5:29 PM  Group Topic/Focus:  Activity Group    Participation Level:  Did Not Attend   [name] [facility] 01/06/2023, 5:29 PM

## 2023-01-07 DIAGNOSIS — F14250 Cocaine dependence with cocaine-induced psychotic disorder with delusions: Secondary | ICD-10-CM | POA: Diagnosis not present

## 2023-01-07 NOTE — Group Note (Signed)
Recreation Therapy Group Note   Group Topic:Relaxation  Group Date: 01/07/2023 Start Time: 1000 End Time: 1050 Facilitators: Vilma Prader, LRT, CTRS Location:  Dayroom  Group Description: PMR (Progressive Muscle Relaxation). LRT asks patients their current level of stress/anxiety from 1-10, with 10 being the highest. LRT educates patients on what PMR is and the benefits that come from it. Patients are asked to sit with their feet flat on the floor while sitting up and all the way back in their chair, if possible. LRT follows prompt that requires the patients to tense and release different muscles in their body and focus on their breathing. During session, lights are off and soft music is being played. At the end of the prompt, LRT asks patients to rank their current levels of stress/anxiety from 1-10, 10 being the highest.   Affect/Mood: N/A   Participation Level: Did not attend    Clinical Observations/Individualized Feedback: Amy Waller did not attend group due to resting in her room.  Plan: Continue to engage patient in RT group sessions 2-3x/week.   Vilma Prader, LRT, CTRS 01/07/2023 11:18 AM

## 2023-01-07 NOTE — Group Note (Signed)
LCSW Group Therapy Note  Group Date: 01/07/2023 Start Time: 1300 End Time: 1400   Type of Therapy and Topic:  Group Therapy - Healthy vs Unhealthy Coping Skills  Participation Level:  Active   Description of Group The focus of this group was to determine what unhealthy coping techniques typically are used by group members and what healthy coping techniques would be helpful in coping with various problems. Patients were guided in becoming aware of the differences between healthy and unhealthy coping techniques. Patients were asked to identify 2-3 healthy coping skills they would like to learn to use more effectively.  Therapeutic Goals Patients learned that coping is what human beings do all day long to deal with various situations in their lives Patients defined and discussed healthy vs unhealthy coping techniques Patients identified their preferred coping techniques and identified whether these were healthy or unhealthy Patients determined 2-3 healthy coping skills they would like to become more familiar with and use more often. Patients provided support and ideas to each other   Summary of Patient Progress:   Patient was present for the entirety of the group session. Patient was an active listener and participated in the topic of discussion, provided helpful advice to others, and added nuance to topic of conversation. Patient shared she has been having difficult with communicating with others effectively.   Therapeutic Modalities Cognitive Behavioral Therapy Motivational Interviewing  Durenda Hurt, [place] 01/07/2023  2:30 PM

## 2023-01-07 NOTE — Progress Notes (Signed)
[facility] MD Progress Note  01/07/2023 11:35 AM Amy Waller  MRN:  [id number] Subjective: Follow-up for this patient with substance induced agitation.  Patient seen and chart reviewed.  Patient says that she feels that she is not quite ready to go home.  She says she needs to rest up more to get her irritability under control and deal with the stress at home.  Denies current suicidal or homicidal ideation.  Has been cooperative with treatment.  Has not displayed any dangerous behavior. 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 1998   Family History:  Family History  Problem Relation Age of Onset   Cancer Mother 34       ovarian   Hypertension Mother    Ovarian cancer Mother    Cancer Sister 16       throat   Hypertension Sister    Cancer Maternal Uncle        throat   Hypertension Maternal Uncle    Breast cancer Maternal Aunt    Colon cancer Neg Hx    Family Psychiatric  History: See previous Social History:  Social History   Substance and Sexual Activity  Alcohol Use Yes   Comment: occasionally     Social History   Substance and Sexual Activity  Drug Use Not Currently   Types: Cocaine    Social History   Socioeconomic History   Marital status: Single    Spouse name: Not on file   Number of children:  Not on file   Years of education: Not on file   Highest education level: Not on file  Occupational History   Not on file  Tobacco Use   Smoking status: Every Day    Packs/day: 0.25    Years: 10.00    Additional pack years: 0.00    Total pack years: 2.50    Types: Cigarettes   Smokeless tobacco: Never  Vaping Use   Vaping Use: Never used  Substance and Sexual Activity   Alcohol use: Yes    Comment: occasionally   Drug use: Not Currently    Types: Cocaine   Sexual activity: Yes    Birth control/protection: None  Other Topics Concern   Not on file  Social History Narrative   Not on file   Social Determinants of Health   Financial Resource Strain: Not on file  Food Insecurity: No Food Insecurity (01/06/2023)   Hunger Vital Sign    Worried About Running Out of Food in the Last Year: Never true    Ran Out of Food in the Last Year: Never true  Transportation Needs: No Transportation Needs (01/06/2023)   PRAPARE - Hydrologist (Medical): No    Lack of Transportation (Non-Medical): No  Physical Activity: Not on file  Stress: Not  on file  Social Connections: Not on file   Additional Social History:                         Sleep: Fair  Appetite:  Fair  Current Medications: Current Facility-Administered Medications  Medication Dose Route Frequency Provider Last Rate Last Admin   acetaminophen (TYLENOL) tablet 650 mg  650 mg Oral Q6H PRN Caroline Sauger, NP       alum & mag hydroxide-simeth (MAALOX/MYLANTA) 200-200-20 MG/5ML suspension 30 mL  30 mL Oral Q4H PRN Caroline Sauger, NP       amLODipine (NORVASC) tablet 5 mg  5 mg Oral Daily Ashir Kunz, Madie Reno, MD   5 mg at 01/07/23 [id number]   haloperidol (HALDOL) tablet 5 mg  5 mg Oral Q6H PRN Caroline Sauger, NP       And   benztropine (COGENTIN) tablet 1 mg  1 mg Oral Q6H PRN Caroline Sauger, NP       clotrimazole (GYNE-LOTRIMIN) vaginal cream 1 Applicatorful  1 Applicatorful  Vaginal QHS Noralee Space, RPH       risperiDONE (RISPERDAL M-TABS) disintegrating tablet 2 mg  2 mg Oral Q8H PRN Caroline Sauger, NP       And   LORazepam (ATIVAN) tablet 1 mg  1 mg Oral PRN Caroline Sauger, NP       And   ziprasidone (GEODON) injection 20 mg  20 mg Intramuscular PRN Caroline Sauger, NP       magnesium hydroxide (MILK OF MAGNESIA) suspension 30 mL  30 mL Oral Daily PRN Caroline Sauger, NP       nicotine (NICODERM CQ - dosed in mg/24 hours) patch 21 mg  21 mg Transdermal Daily Romain Erion, Madie Reno, MD   21 mg at 01/07/23 [id number]   nicotine polacrilex (NICORETTE) gum 2 mg  2 mg Oral PRN Storey Stangeland, Madie Reno, MD        Lab Results:  Results for orders placed or performed during the hospital encounter of 01/05/23 (from the past 48 hour(s))  Comprehensive metabolic panel     Status: Abnormal   Collection Time: 01/05/23  5:07 PM  Result Value Ref Range   Sodium 142 135 - 145 mmol/L   Potassium 3.1 (L) 3.5 - 5.1 mmol/L   Chloride 109 98 - 111 mmol/L   CO2 22 22 - 32 mmol/L   Glucose, Bld 75 70 - 99 mg/dL    Comment: Glucose reference range applies only to samples taken after fasting for at least 8 hours.   BUN 11 6 - 20 mg/dL   Creatinine, Ser 0.65 0.44 - 1.00 mg/dL   Calcium 8.8 (L) 8.9 - 10.3 mg/dL   Total Protein 6.8 6.5 - 8.1 g/dL   Albumin 3.9 3.5 - 5.0 g/dL   AST 22 15 - 41 U/L   ALT 22 0 - 44 U/L   Alkaline Phosphatase 63 38 - 126 U/L   Total Bilirubin 0.5 0.3 - 1.2 mg/dL   GFR, Estimated >60 >60 mL/min    Comment: (NOTE) Calculated using the CKD-EPI Creatinine Equation (2021)    Anion gap 11 5 - 15    Comment: Performed at [facility], [address] [place], [place] [address]  Ethanol     Status: Abnormal   Collection Time: 01/05/23  5:07 PM  Result Value Ref Range   Alcohol, Ethyl (B) 10 (H) <10 mg/dL    Comment: (NOTE) Lowest detectable limit for serum alcohol  is 10 mg/dL.  For medical purposes only. Performed at [facility], [address] [place], [place] [address]   Salicylate level     Status: Abnormal   Collection Time: 01/05/23  5:07 PM  Result Value Ref Range   Salicylate Lvl <6.4 (L) 7.0 - 30.0 mg/dL    Comment: Performed at [facility], [address] [place], [place] [address]  Acetaminophen level     Status: Abnormal   Collection Time: 01/05/23  5:07 PM  Result Value Ref Range   Acetaminophen (Tylenol), Serum <10 (L) 10 - 30 ug/mL    Comment: (NOTE) Therapeutic concentrations vary significantly. A range of 10-30 ug/mL  may be an effective concentration for many patients. However, some  are best treated at concentrations outside of this range. Acetaminophen concentrations >150 ug/mL at 4 hours after ingestion  and >50 ug/mL at 12 hours after ingestion are often associated with  toxic reactions.  Performed at [facility], [address] [place], [place] [address]   cbc     Status: None   Collection Time: 01/05/23  5:07 PM  Result Value Ref Range   WBC 4.1 4.0 - 10.5 K/uL   RBC 4.49 3.87 - 5.11 MIL/uL   Hemoglobin 13.5 12.0 - 15.0 g/dL   HCT 41.2 36.0 - 46.0 %   MCV 91.8 80.0 - 100.0 fL   MCH 30.1 26.0 - 34.0 pg   MCHC 32.8 30.0 - 36.0 g/dL   RDW 14.3 11.5 - 15.5 %   Platelets 260 150 - 400 K/uL   nRBC 0.0 0.0 - 0.2 %    Comment: Performed at [facility], [address] [place], [place] [address]  Urine Drug Screen, Qualitative     Status: Abnormal   Collection Time: 01/05/23  9:48 PM  Result Value Ref Range   Tricyclic, Ur Screen NONE DETECTED NONE DETECTED   Amphetamines, Ur Screen NONE DETECTED NONE DETECTED   MDMA (Ecstasy)Ur Screen NONE DETECTED NONE DETECTED   Cocaine Metabolite,Ur [place] POSITIVE (A) NONE DETECTED   Opiate, Ur Screen NONE DETECTED NONE DETECTED   Phencyclidine (PCP) Ur S NONE DETECTED NONE DETECTED   Cannabinoid 50 Ng, Ur [place] NONE DETECTED NONE DETECTED   Barbiturates, Ur Screen NONE DETECTED NONE DETECTED    Benzodiazepine, Ur Scrn NONE DETECTED NONE DETECTED   Methadone Scn, Ur NONE DETECTED NONE DETECTED    Comment: (NOTE) Tricyclics + metabolites, urine    Cutoff 1000 ng/mL Amphetamines + metabolites, urine  Cutoff 1000 ng/mL MDMA (Ecstasy), urine              Cutoff 500 ng/mL Cocaine Metabolite, urine          Cutoff 300 ng/mL Opiate + metabolites, urine        Cutoff 300 ng/mL Phencyclidine (PCP), urine         Cutoff 25 ng/mL Cannabinoid, urine                 Cutoff 50 ng/mL Barbiturates + metabolites, urine  Cutoff 200 ng/mL Benzodiazepine, urine              Cutoff 200 ng/mL Methadone, urine                   Cutoff 300 ng/mL  The urine drug screen provides only a preliminary, unconfirmed analytical test result and should not be used for non-medical purposes. Clinical consideration and professional judgment should be applied to any positive drug  screen result due to possible interfering substances. A more specific alternate chemical method must be used in order to obtain a confirmed analytical result. Gas chromatography / mass spectrometry (GC/MS) is the preferred confirm atory method. Performed at [facility], [address] [place], [place] [address]   Pregnancy, urine     Status: None   Collection Time: 01/05/23  9:48 PM  Result Value Ref Range   Preg Test, Ur NEGATIVE NEGATIVE    Comment: Performed at [facility], [address] [place], [place] [address]  Resp panel by RT-PCR (RSV, Flu A&B, Covid) Anterior Nasal Swab     Status: None   Collection Time: 01/05/23 10:53 PM   Specimen: Anterior Nasal Swab  Result Value Ref Range   SARS Coronavirus 2 by RT PCR NEGATIVE NEGATIVE    Comment: (NOTE) SARS-CoV-2 target nucleic acids are NOT DETECTED.  The SARS-CoV-2 RNA is generally detectable in upper respiratory specimens during the acute phase of infection. The lowest concentration of SARS-CoV-2 viral copies this assay can detect is 138 copies/mL. A  negative result does not preclude SARS-Cov-2 infection and should not be used as the sole basis for treatment or other patient management decisions. A negative result may occur with  improper specimen collection/handling, submission of specimen other than nasopharyngeal swab, presence of viral mutation(s) within the areas targeted by this assay, and inadequate number of viral copies(<138 copies/mL). A negative result must be combined with clinical observations, patient history, and epidemiological information. The expected result is Negative.  Fact Sheet for Patients:  EntrepreneurPulse.com.au  Fact Sheet for Healthcare Providers:  IncredibleEmployment.be  This test is no t yet approved or cleared by the [place] FDA and  has been authorized for detection and/or diagnosis of SARS-CoV-2 by FDA under an Emergency Use Authorization (EUA). This EUA will remain  in effect (meaning this test can be used) for the duration of the COVID-19 declaration under Section 564(b)(1) of the Act, 21 U.S.C.section 360bbb-3(b)(1), unless the authorization is terminated  or revoked sooner.       Influenza A by PCR NEGATIVE NEGATIVE   Influenza B by PCR NEGATIVE NEGATIVE    Comment: (NOTE) The Xpert Xpress SARS-CoV-2/FLU/RSV plus assay is intended as an aid in the diagnosis of influenza from Nasopharyngeal swab specimens and should not be used as a sole basis for treatment. Nasal washings and aspirates are unacceptable for Xpert Xpress SARS-CoV-2/FLU/RSV testing.  Fact Sheet for Patients: EntrepreneurPulse.com.au  Fact Sheet for Healthcare Providers: IncredibleEmployment.be  This test is not yet approved or cleared by the [place] FDA and has been authorized for detection and/or diagnosis of SARS-CoV-2 by FDA under an Emergency Use Authorization (EUA). This EUA will remain in effect (meaning this test can be used)  for the duration of the COVID-19 declaration under Section 564(b)(1) of the Act, 21 U.S.C. section 360bbb-3(b)(1), unless the authorization is terminated or revoked.     Resp Syncytial Virus by PCR NEGATIVE NEGATIVE    Comment: (NOTE) Fact Sheet for Patients: EntrepreneurPulse.com.au  Fact Sheet for Healthcare Providers: IncredibleEmployment.be  This test is not yet approved or cleared by the [place] FDA and has been authorized for detection and/or diagnosis of SARS-CoV-2 by FDA under an Emergency Use Authorization (EUA). This EUA will remain in effect (meaning this test can be used) for the duration of the COVID-19 declaration under Section 564(b)(1) of the Act, 21 U.S.C. section 360bbb-3(b)(1), unless the authorization is terminated or revoked.  Performed at [facility],  [place], [place] [address]     Blood Alcohol level:  Lab Results  Component Value Date   ETH 10 (H) 01/05/2023   ETH <10 [id number]    Metabolic Disorder Labs: No results found for: "HGBA1C", "MPG" No results found for: "PROLACTIN" No results found for: "CHOL", "TRIG", "HDL", "CHOLHDL", "VLDL", "LDLCALC"  Physical Findings: AIMS:  , ,  ,  ,    CIWA:    COWS:     Musculoskeletal: Strength & Muscle Tone: within normal limits Gait & Station: normal Patient leans: N/A  Psychiatric Specialty Exam:  Presentation  General Appearance:  Bizarre  Eye Contact: Fair  Speech: Clear and Coherent  Speech Volume: Normal  Handedness: Right   Mood and Affect  Mood: Euthymic  Affect: Congruent   Thought Process  Thought Processes: Disorganized  Descriptions of Associations:Loose  Orientation:Full (Time, Place and Person)  Thought Content:Logical  History of Schizophrenia/Schizoaffective disorder:Yes  Duration of Psychotic Symptoms:Greater than six months  Hallucinations:No data recorded Ideas of  Reference:Delusions  Suicidal Thoughts:No data recorded Homicidal Thoughts:No data recorded  Sensorium  Memory: Immediate Fair; Recent Fair; Remote Fair  Judgment: Poor  Insight: Poor   Executive Functions  Concentration: Poor  Attention Span: Poor  Recall: Poor  Fund of Knowledge: Poor  Language: Poor   Psychomotor Activity  Psychomotor Activity:No data recorded  Assets  Assets: Communication Skills; Physical Health; Resilience; Housing; Catering manager; Intimacy   Sleep  Sleep:No data recorded   Physical Exam: Physical Exam Vitals and nursing note reviewed.  Constitutional:      Appearance: Normal appearance.  HENT:     Head: Normocephalic and atraumatic.     Mouth/Throat:     Pharynx: Oropharynx is clear.  Eyes:     Pupils: Pupils are equal, round, and reactive to light.  Cardiovascular:     Rate and Rhythm: Normal rate and regular rhythm.  Pulmonary:     Effort: Pulmonary effort is normal.     Breath sounds: Normal breath sounds.  Abdominal:     General: Abdomen is flat.     Palpations: Abdomen is soft.  Musculoskeletal:        General: Normal range of motion.  Skin:    General: Skin is warm and dry.  Neurological:     General: No focal deficit present.     Mental Status: She is alert. Mental status is at baseline.  Psychiatric:        Attention and Perception: Attention normal.        Mood and Affect: Mood normal.        Speech: Speech normal.        Behavior: Behavior normal.        Thought Content: Thought content normal.        Cognition and Memory: Cognition normal.        Judgment: Judgment normal.    Review of Systems  Constitutional: Negative.   HENT: Negative.    Eyes: Negative.   Respiratory: Negative.    Cardiovascular: Negative.   Gastrointestinal: Negative.   Musculoskeletal: Negative.   Skin: Negative.   Neurological: Negative.   Psychiatric/Behavioral: Negative.     Blood pressure (!) 136/98,  pulse 79, temperature 97.8 F (36.6 C), temperature source Oral, resp. rate 18, height 5\' 1"  (1.549 m), weight 42.2 kg, last menstrual period 10/18/2015, SpO2 100 %. Body mass index is 17.57 kg/m.   Treatment Plan Summary: Plan no change to medication.  Patient given supportive counseling and  encouragement.  Seems to be good judgment to let her rest another day to recover from the cocaine induced psychosis before looking at discharge likely tomorrow.  Alethia Berthold, MD 01/07/2023, 11:35 AM

## 2023-01-07 NOTE — [facility] Group Notes (Signed)
[facility] Group Notes:  (Nursing/MHT/Case Management/Adjunct)  Date:  01/07/2023  Time:  8:42 PM  Type of Therapy:   Wrap up  Participation Level:  Active  Participation Quality:  Appropriate  Affect:  Appropriate  Cognitive:  Alert  Insight:  Good  Engagement in Group:  Engaged and goal was to go outside and exercise.  Modes of Intervention:  Support  Summary of Progress/Problems:  Amy Waller 01/07/2023, 8:42 PM

## 2023-01-07 NOTE — Group Note (Signed)
Date:  01/07/2023 Time:  9:43 AM  Group Topic/Focus:  Goals Group:   The focus of this group is to help patients establish daily goals to achieve during treatment and discuss how the patient can incorporate goal setting into their daily lives to aide in recovery. Community Meeting    Participation Level:  Did Not Attend  [name] [facility] 01/07/2023, 9:43 AM

## 2023-01-07 NOTE — Progress Notes (Signed)
D- Patient alert and oriented x 4. Affect bright/mood sleepy. Denies SI/ HI/ AVH. She denies pain, depression or anxiety. She states the vaginal itching and discomfort has subsided. She c/o constipation. A- Scheduled medications administered to patient, per MD orders. PRN Milk of magnesia administered PO for c/o constipation- effective. Support and encouragement provided.  Routine safety checks conducted every 15 minutes.  Patient informed to notify staff with problems or concerns. R- No adverse drug reactions noted. Patient contracts for safety at this time. Patient compliant with medications and treatment plan. Patient receptive, calm, and cooperative and interacts well with others on the unit.  Patient contracts for safety and remains safe on the unit at this time.

## 2023-01-07 NOTE — Plan of Care (Signed)

## 2023-01-08 DIAGNOSIS — F14250 Cocaine dependence with cocaine-induced psychotic disorder with delusions: Secondary | ICD-10-CM | POA: Diagnosis not present

## 2023-01-08 MED ORDER — DOCUSATE SODIUM 100 MG PO CAPS: 100.0000 mg | ORAL_CAPSULE | Freq: Two times a day (BID) | ORAL | 1 refills | Status: AC

## 2023-01-08 MED ORDER — AMLODIPINE BESYLATE 5 MG PO TABS
5.0000 mg | ORAL_TABLET | Freq: Once | ORAL | Status: AC
  Administered 2023-01-08: 5 mg via ORAL

## 2023-01-08 MED ORDER — CLOTRIMAZOLE 1 % VA CREA: 1.0000 | TOPICAL_CREAM | Freq: Every day | VAGINAL | 0 refills | Status: AC

## 2023-01-08 MED ORDER — NICOTINE 21 MG/24HR TD PT24: 21.0000 mg | MEDICATED_PATCH | Freq: Every day | TRANSDERMAL | 1 refills | Status: AC

## 2023-01-08 MED ORDER — AMLODIPINE BESYLATE 10 MG PO TABS: 10.0000 mg | ORAL_TABLET | Freq: Every day | ORAL | 1 refills | Status: DC

## 2023-01-08 MED ORDER — AMLODIPINE BESYLATE 5 MG PO TABS: 10.0000 mg | ORAL_TABLET | Freq: Every day | ORAL | Status: DC

## 2023-01-08 MED ORDER — DOCUSATE SODIUM 100 MG PO CAPS: 100.0000 mg | ORAL_CAPSULE | Freq: Two times a day (BID) | ORAL | Status: DC

## 2023-01-08 MED ORDER — NICOTINE POLACRILEX 2 MG MT GUM: 2.0000 mg | CHEWING_GUM | OROMUCOSAL | 1 refills | Status: AC | PRN

## 2023-01-08 NOTE — Plan of Care (Signed)

## 2023-01-08 NOTE — Discharge Summary (Signed)
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 1998   Family History:  Family History  Problem Relation Age of Onset   Cancer Mother 26       ovarian   Hypertension Mother    Ovarian cancer Mother    Cancer Sister 53       throat   Hypertension Sister    Cancer Maternal Uncle        throat   Hypertension Maternal Uncle    Breast cancer Maternal Aunt    Colon cancer Neg Hx    Family Psychiatric  History: Family history of substance abuse Social History:  Social History   Substance and Sexual Activity  Alcohol Use Yes   Comment: occasionally     Social History   Substance and Sexual Activity  Drug Use Not Currently   Types: Cocaine    Social History   Socioeconomic History   Marital status: Single    Spouse name: Not on file   Number of  children: Not on file   Years of education: Not on file   Highest education level: Not on file  Occupational History   Not on file  Tobacco Use   Smoking status: Every Day    Packs/day: 0.25    Years: 10.00    Additional pack years: 0.00    Total pack years: 2.50    Types: Cigarettes   Smokeless tobacco: Never  Vaping Use   Vaping Use: Never used  Substance and Sexual Activity   Alcohol use: Yes    Comment: occasionally   Drug use: Not Currently    Types: Cocaine   Sexual activity: Yes    Birth control/protection: None  Other Topics Concern   Not on file  Social History Narrative   Not on file   Social Determinants of Health   Financial Resource Strain: Not on file  Food Insecurity: No Food Insecurity (01/06/2023)   Hunger Vital Sign    Worried About Running Out of Food in the Last Year: Never true    Ran Out of Food in the Last Year: Never true  Transportation Needs: No Transportation Needs (01/06/2023)   PRAPARE - Hydrologist (Medical): No    Lack of Transportation (Non-Medical): No  Physical Activity: Not on file  Stress: Not on file  Social Connections: Not on  file    Hospital Course: Patient did not display any dangerous aggressive violent or suicidal behavior while in the hospital.  There was no evidence of psychosis.  No psychiatric medicines were started.  Patient rested over 2 days and participated appropriately in interactions with staff and peers.  Patient was started on antihypertensive medication for her persistent high blood pressure which seems to just be essential hypertension.  Also given treatment for vaginal candidiasis and constipation.  Patient will be referred to outpatient treatment with recommendation that she stop using cocaine  Physical Findings: AIMS:  , ,  ,  ,    CIWA:    COWS:     Musculoskeletal: Strength & Muscle Tone: within normal limits Gait & Station: normal Patient leans: N/A   Psychiatric  Specialty Exam:  Presentation  General Appearance:  Bizarre  Eye Contact: Fair  Speech: Clear and Coherent  Speech Volume: Normal  Handedness: Right   Mood and Affect  Mood: Euthymic  Affect: Congruent   Thought Process  Thought Processes: Disorganized  Descriptions of Associations:Loose  Orientation:Full (Time, Place and Person)  Thought Content:Logical  History of Schizophrenia/Schizoaffective disorder:Yes  Duration of Psychotic Symptoms:Greater than six months  Hallucinations:No data recorded Ideas of Reference:Delusions  Suicidal Thoughts:No data recorded Homicidal Thoughts:No data recorded  Sensorium  Memory: Immediate Fair; Recent Fair; Remote Fair  Judgment: Poor  Insight: Poor   Executive Functions  Concentration: Poor  Attention Span: Poor  Recall: Poor  Fund of Knowledge: Poor  Language: Poor   Psychomotor Activity  Psychomotor Activity:No data recorded  Assets  Assets: Communication Skills; Physical Health; Resilience; Housing; Catering manager; Intimacy   Sleep  Sleep:No data recorded   Physical Exam: Physical Exam Vitals and nursing note reviewed.  Constitutional:      Appearance: Normal appearance.  HENT:     Head: Normocephalic and atraumatic.     Mouth/Throat:     Pharynx: Oropharynx is clear.  Eyes:     Pupils: Pupils are equal, round, and reactive to light.  Cardiovascular:     Rate and Rhythm: Normal rate and regular rhythm.  Pulmonary:     Effort: Pulmonary effort is normal.     Breath sounds: Normal breath sounds.  Abdominal:     General: Abdomen is flat.     Palpations: Abdomen is soft.  Musculoskeletal:        General: Normal range of motion.  Skin:    General: Skin is warm and dry.  Neurological:     General: No focal deficit present.     Mental Status: She is alert. Mental status is at baseline.  Psychiatric:        Attention and Perception: Attention normal.         Mood and Affect: Mood normal.        Speech: Speech normal.        Behavior: Behavior normal.        Thought Content: Thought content normal.        Cognition and Memory: Cognition normal.        Judgment: Judgment normal.    Review of Systems  Constitutional: Negative.   HENT: Negative.    Eyes: Negative.   Respiratory: Negative.    Cardiovascular: Negative.   Gastrointestinal: Negative.   Musculoskeletal: Negative.   Skin: Negative.   Neurological: Negative.   Psychiatric/Behavioral: Negative.     Blood pressure (!) 137/107, pulse 66, temperature 98.5 F (36.9 C), temperature source Oral, resp. rate 16, height 5'  1" (1.549 m), weight 42.2 kg, last menstrual period 10/18/2015, SpO2 100 %. Body mass index is 17.57 kg/m.   Social History   Tobacco Use  Smoking Status Every Day   Packs/day: 0.25   Years: 10.00   Additional pack years: 0.00   Total pack years: 2.50   Types: Cigarettes  Smokeless Tobacco Never   Tobacco Cessation:  A prescription for an FDA-approved tobacco cessation medication provided at discharge   Blood Alcohol level:  Lab Results  Component Value Date   ETH 10 (H) 01/05/2023   ETH <10 [id number]    Metabolic Disorder Labs:  No results found for: "HGBA1C", "MPG" No results found for: "PROLACTIN" No results found for: "CHOL", "TRIG", "HDL", "CHOLHDL", "VLDL", "[place]"  See Psychiatric Specialty Exam and Suicide Risk Assessment completed by Attending Physician prior to discharge.  Discharge destination:  Home  Is patient on multiple antipsychotic therapies at discharge:  No   Has Patient had three or more failed trials of antipsychotic monotherapy by history:  No  Recommended Plan for Multiple Antipsychotic Therapies: NA  Discharge Instructions     Diet - low sodium heart healthy   Complete by: As directed    Increase activity slowly   Complete by: As directed       Allergies as of 01/08/2023   No Known Allergies       Medication List     STOP taking these medications    atenolol 50 MG tablet Commonly known as: TENORMIN   meloxicam 15 MG tablet Commonly known as: MOBIC   methocarbamol 500 MG tablet Commonly known as: ROBAXIN       TAKE these medications      Indication  amLODipine 10 MG tablet Commonly known as: NORVASC Take 1 tablet (10 mg total) by mouth daily. Start taking on: January 09, 2023  Indication: High Blood Pressure Disorder   clotrimazole 1 % vaginal cream Commonly known as: GYNE-LOTRIMIN Place 1 Applicatorful vaginally at bedtime.  Indication: Vagina and Vulva Infection due to Candida Species Fungus   docusate sodium 100 MG capsule Commonly known as: COLACE Take 1 capsule (100 mg total) by mouth 2 (two) times daily.  Indication: Constipation   nicotine 21 mg/24hr patch Commonly known as: NICODERM CQ - dosed in mg/24 hours Place 1 patch (21 mg total) onto the skin daily.  Indication: Nicotine Addiction   nicotine polacrilex 2 MG gum Commonly known as: NICORETTE Take 1 each (2 mg total) by mouth as needed for smoking cessation.  Indication: Nicotine Addiction        Follow-up Information     [facility] Follow up.   Why: Although you declined scheduled mental health follow up, this facility provides services on a walk-in basis, on Mondays, Wednesdays, and Friday from 8am-4pm. They recommend that you arrive prior to 7:30am the day you walk in, to be seen the same day, as clients are seen on a first come, first served basis. Contact information: [place] [address] [phone]                 Follow-up recommendations:  Other:  Discontinue cocaine use follow-up with [facility]  Comments: See above  Signed: Alethia Berthold, MD 01/08/2023, 11:31 AM

## 2023-01-08 NOTE — BHH Suicide Risk Assessment (Signed)
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) Active Problems:   Vaginal irritation   Psychosis ([place])   Essential hypertension   Total Time spent with patient: 30 minutes  Musculoskeletal: Strength & Muscle Tone: within normal limits Gait & Station: normal Patient leans: N/A  Psychiatric Specialty Exam  Presentation  General Appearance:  Bizarre  Eye Contact: Fair  Speech: Clear and Coherent  Speech Volume: Normal  Handedness: Right   Mood and Affect  Mood: Euthymic  Duration of Depression Symptoms: No data recorded Affect: Congruent   Thought Process  Thought Processes: Disorganized  Descriptions of Associations:Loose  Orientation:Full (Time, Place and Person)  Thought Content:Logical  History of Schizophrenia/Schizoaffective disorder:Yes  Duration of Psychotic Symptoms:Greater than six months  Hallucinations:No data recorded Ideas of Reference:Delusions  Suicidal Thoughts:No data recorded Homicidal Thoughts:No data recorded  Sensorium  Memory: Immediate Fair; Recent Fair; Remote Fair  Judgment: Poor  Insight: Poor   Executive Functions  Concentration: Poor  Attention Span: Poor  Recall: Poor  Fund of Knowledge: Poor  Language: Poor   Psychomotor Activity  Psychomotor Activity:No data recorded  Assets  Assets: Communication Skills; Physical Health; Resilience; Housing; Catering manager; Intimacy   Sleep  Sleep:No data recorded  Physical Exam: Physical Exam Vitals and nursing note reviewed.  Constitutional:      Appearance: Normal appearance.  HENT:     Head: Normocephalic and atraumatic.     Mouth/Throat:     Pharynx: Oropharynx is clear.  Eyes:      Pupils: Pupils are equal, round, and reactive to light.  Cardiovascular:     Rate and Rhythm: Normal rate and regular rhythm.  Pulmonary:     Effort: Pulmonary effort is normal.     Breath sounds: Normal breath sounds.  Abdominal:     General: Abdomen is flat.     Palpations: Abdomen is soft.  Musculoskeletal:        General: Normal range of motion.  Skin:    General: Skin is warm and dry.  Neurological:     General: No focal deficit present.     Mental Status: She is alert. Mental status is at baseline.  Psychiatric:        Attention and Perception: Attention normal.        Mood and Affect: Mood normal.        Speech: Speech normal.        Behavior: Behavior normal.        Thought Content: Thought content normal.        Cognition and Memory: Cognition normal.    Review of Systems  Constitutional: Negative.   HENT: Negative.    Eyes: Negative.   Respiratory: Negative.    Cardiovascular: Negative.   Gastrointestinal: Negative.   Musculoskeletal: Negative.   Skin: Negative.   Neurological: Negative.   Psychiatric/Behavioral: Negative.     Blood pressure (!) 137/107, pulse 66, temperature 98.5 F (36.9 C), temperature source Oral, resp. rate 16, height 5\' 1"  (1.549 m), weight 42.2 kg, last menstrual period 10/18/2015, SpO2 100 %. Body mass index is 17.57 kg/m.  Mental Status Per Nursing Assessment::   On Admission:  NA  Demographic Factors:  Unemployed  Loss Factors: NA  Historical Factors: Impulsivity  Risk Reduction Factors:   Living with another person, especially a relative and Positive  social support  Continued Clinical Symptoms:  Severe Anxiety and/or Agitation Alcohol/Substance Abuse/Dependencies  Cognitive Features That Contribute To Risk:  None    Suicide Risk:  Minimal: No identifiable suicidal ideation.  Patients presenting with no risk factors but with morbid ruminations; may be classified as minimal risk based on the severity of the depressive  symptoms   Follow-up Information     [facility] Follow up.   Why: Although you declined scheduled mental health follow up, this facility provides services on a walk-in basis, on Mondays, Wednesdays, and Friday from 8am-4pm. They recommend that you arrive prior to 7:30am the day you walk in, to be seen the same day, as clients are seen on a first come, first served basis. Contact information: [place] [address] [phone]                 Plan Of Care/Follow-up recommendations:  Other:  Patient denies any suicidal ideation.  Behavior has been calm and appropriate throughout her time in the hospital.  Lucid without any sign of psychosis.  Primary issue most likely related to cocaine intoxication.  Patient counseled to avoid drug abuse and will be followed for therapy with referral to [facility].  Alethia Berthold, MD 01/08/2023, 11:28 AM

## 2023-01-08 NOTE — Progress Notes (Signed)
  [facility] Adult Case Management Discharge Plan :  Will you be returning to the same living situation after discharge:  Yes,  pt plans to return home. At discharge, do you have transportation home?: Yes,  pt support system to provide transportation around 3-3:30pm today.  Do you have the ability to pay for your medications: Yes,  [employer].  Release of information consent forms completed and in the chart;  Patient's signature needed at discharge.  Patient to Follow up at:  Follow-up Information     [facility] Follow up.   Why: Although you declined scheduled mental health follow up, this facility provides services on a walk-in basis, on Mondays, Wednesdays, and Friday from 8am-4pm. They recommend that you arrive prior to 7:30am the day you walk in, to be seen the same day, as clients are seen on a first come, first served basis. Contact information: [place] [address] [phone]                 Next level of care provider has access to [place] and Suicide Prevention discussed: Yes,  SPE completed with pt.     Has patient been referred to the Quitline?: Yes, faxed on 01/08/23.  Patient has been referred for addiction treatment: Pt. refused referral  Shirl Harris, LCSW 01/08/2023, 11:08 AM

## 2023-01-08 NOTE — Group Note (Signed)
Recreation Therapy Group Note   Group Topic:Self-Esteem  Group Date: 01/08/2023 Start Time: 1000 End Time: 1105 Facilitators: Vilma Prader, LRT, CTRS Location:  [facility] Room  Group Description: Patients and LRT discussed the importance of self-love and self-esteem. Pt completed a worksheet that helps them identify 24 different strengths and qualities about themselves. Pt encouraged to read aloud at least 3 off their sheet to the group. LRT and pts discussed how this can be applied to daily life post-discharge.  Pt's then played "Positive Affirmation Bingo" afterwards, with journals or stress balls as bingo prizes.   Affect/Mood: Appropriate   Participation Level: Moderate and Engaged   Participation Quality: Independent   Behavior: Alert, Appropriate, and Calm   Speech/Thought Process: Coherent   Insight: Fair   Judgement: Good   Modes of Intervention: Guided Discussion and Worksheet   Patient Response to Interventions:  Attentive, Engaged, and Receptive   Education Outcome:  In group clarification offered    Clinical Observations/Individualized Feedback: Brianni was somewhat active in their participation of session activities and group discussion. Pt came to group halfway into session. Pt participated in the games of Bingo but was not present for the worksheet or guided discussion between LRT and peers. Pt was calm and appropriate when playing bingo.    Plan: Continue to engage patient in RT group sessions 2-3x/week.   Vilma Prader, LRT, CTRS 01/08/2023 11:46 AM

## 2023-01-08 NOTE — Progress Notes (Signed)
Patient ID: Amy Waller, female   DOB: 1961/05/30, 62 y.o.   MRN: [id number] Patient denies SI/HI/AVH. Belongings were returned to patient. Discharge instructions  including medication and follow up information were reviewed with patient and understanding was verbalized. Patient was not observed to be in distress at time of discharge. Patient was escorted out with staff to medical mall to be transported home by partner.

## 2023-01-08 NOTE — Progress Notes (Signed)
Patient calm and pleasant during assessment denying SI/HI/AVH. Patient observed interacting appropriately with staff and peers on the unit. Patient didn't have any medication scheduled tonight and hasn't requested anything PRN. Pt given education, support, and encouragement to be active in her treatment plan. Pt being monitored Q 15 minutes for safety per unit protocol, remains safe on the unit

## 2023-08-06 ENCOUNTER — Emergency Department
Admission: EM | Admit: 2023-08-06 | Discharge: 2023-08-06 | Discharge status: Against Medical Advice | Payer: BLUE CROSS/BLUE SHIELD | Attending: Emergency Medicine | Admitting: Emergency Medicine

## 2023-08-06 ENCOUNTER — Other Ambulatory Visit: Payer: Self-pay

## 2023-08-06 ENCOUNTER — Encounter: Payer: Self-pay | Admitting: Emergency Medicine

## 2023-08-06 DIAGNOSIS — Z5321 Procedure and treatment not carried out due to patient leaving prior to being seen by health care provider: Secondary | ICD-10-CM | POA: Insufficient documentation

## 2023-08-06 DIAGNOSIS — X58XXXA Exposure to other specified factors, initial encounter: Secondary | ICD-10-CM | POA: Insufficient documentation

## 2023-08-06 DIAGNOSIS — T169XXA Foreign body in ear, unspecified ear, initial encounter: Secondary | ICD-10-CM | POA: Insufficient documentation

## 2023-08-06 NOTE — ED Triage Notes (Signed)
Pt reports getting false finger nail stuck inside of ear canal.

## 2023-08-06 NOTE — ED Notes (Signed)
Pt left and stated she will come back in the morning
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
## 2023-08-14 ENCOUNTER — Encounter: Payer: Self-pay | Admitting: Emergency Medicine

## 2023-08-14 ENCOUNTER — Other Ambulatory Visit: Payer: Self-pay

## 2023-08-14 ENCOUNTER — Emergency Department
Admission: EM | Admit: 2023-08-14 | Discharge: 2023-08-14 | Disposition: A | Discharge status: Home / Self Care | Payer: BLUE CROSS/BLUE SHIELD | Attending: Emergency Medicine | Admitting: Emergency Medicine

## 2023-08-14 DIAGNOSIS — T162XXA Foreign body in left ear, initial encounter: Secondary | ICD-10-CM | POA: Insufficient documentation

## 2023-08-14 DIAGNOSIS — W448XXA Other foreign body entering into or through a natural orifice, initial encounter: Secondary | ICD-10-CM | POA: Diagnosis not present

## 2023-08-14 DIAGNOSIS — I10 Essential (primary) hypertension: Secondary | ICD-10-CM | POA: Insufficient documentation

## 2023-08-14 MED ORDER — OFLOXACIN 0.3 % OT SOLN: 5.0000 [drp] | Freq: Two times a day (BID) | OTIC | 0 refills | Status: AC

## 2023-08-14 NOTE — Discharge Instructions (Signed)
Please use the antibiotic eardrops as prescribed and follow-up with the ear nose and throat doctor whose information is attached.  You can call the office to schedule an appointment.

## 2023-08-14 NOTE — ED Triage Notes (Addendum)
Pt via POV from home. States she feels like something is in the L ear, denies pain. States it has been there the last 2-3 days. Denies fever. Pt is A&Ox4 and NAD, ambulatory to triage.

## 2023-08-14 NOTE — ED Provider Notes (Signed)
[facility] Provider Note    Event Date/Time   First MD Initiated Contact with Patient 08/14/23 0720     (approximate)   History   Foreign Body in Ear   HPI  Amy Waller is a 62 y.o. female with PMH of HTN, arthritis and cocaine use presents for evaluation of foreign body in her left ear.  Patient states a couple days ago she was taking her earrings out and felt like something fell in her ear so she tried using her nail to did get out and is afraid that her nail broke off in her ear.  She says her hearing is different on that side but says it does not hurt.      Physical Exam   Triage Vital Signs: ED Triage Vitals [08/14/23 0714]  Encounter Vitals Group     BP (!) 137/91     Systolic BP Percentile      Diastolic BP Percentile      Pulse Rate 81     Resp 20     Temp 98.5 F (36.9 C)     Temp Source Oral     SpO2 98 %     Weight 110 lb (49.9 kg)     Height 5\' 1"  (1.549 m)     Head Circumference      Peak Flow      Pain Score 0     Pain Loc      Pain Education      Exclude from Growth Chart     Most recent vital signs: Vitals:   08/14/23 0714  BP: (!) 137/91  Pulse: 81  Resp: 20  Temp: 98.5 F (36.9 C)  SpO2: 98%    General: Awake, no distress.  CV:  Good peripheral perfusion.  Resp:  Normal effort.  Abd:  No distention.  Other:  Left EAC is erythematous and does have a small abrasion, I cannot identify a foreign body in the ear canal, difficult to visualize the TM due to the ear canal anatomy and patient reports pain on exam   ED Results / Procedures / Treatments   Labs (all labs ordered are listed, but only abnormal results are displayed) Labs Reviewed - No data to display    PROCEDURES:  Critical Care performed: No  Procedures   MEDICATIONS ORDERED IN ED: Medications - No data to display   IMPRESSION / MDM / ASSESSMENT AND PLAN / ED COURSE  I reviewed the triage vital signs and the nursing notes.                              62 year old female presents for evaluation of foreign body of the left ear.  Patient was hypertensive in triage but does have history of hypertension otherwise vital signs are stable.  Patient NAD on exam.  Differential diagnosis includes, but is not limited to, foreign body, otitis media, otitis externa, barotrauma.  Patient's presentation is most consistent with acute, uncomplicated illness.  I was not able to identify a foreign patient's left ear canal.  It was difficult to visualize her TM due to her ear canal anatomy and patient was pulling away on exam due to pain.  It does appear that there is a small abrasion on patient's ear canal and she reports multiple times to try to get things out on her own using tweezers.  I did flush patient's ear canal with 10  mL of saline which do this would help.  There is no change in her condition.  Given that she does have a small abrasion on give her some antibiotic eardrops to prevent development of an infection and I advised her to follow-up with ENT if this did not help.  Patient was agreeable to plan, voiced understanding of stable at discharge.     FINAL CLINICAL IMPRESSION(S) / ED DIAGNOSES   Final diagnoses:  Foreign body of left ear, initial encounter     Rx / DC Orders   ED Discharge Orders          Ordered    ofloxacin (FLOXIN) 0.3 % OTIC solution  2 times daily        08/14/23 8119             Note:  This document was prepared using Dragon voice recognition software and may include unintentional dictation errors.   Cameron Ali, PA-C 08/14/23 0800    Merwyn Katos, MD 08/14/23 [phone]

## 2023-11-27 ENCOUNTER — Emergency Department
Admission: EM | Admit: 2023-11-27 | Discharge: 2023-11-27 | Disposition: A | Discharge status: Home / Self Care | Payer: BLUE CROSS/BLUE SHIELD | Attending: Emergency Medicine | Admitting: Emergency Medicine

## 2023-11-27 ENCOUNTER — Other Ambulatory Visit: Payer: Self-pay

## 2023-11-27 ENCOUNTER — Emergency Department: Payer: BLUE CROSS/BLUE SHIELD

## 2023-11-27 DIAGNOSIS — I10 Essential (primary) hypertension: Secondary | ICD-10-CM | POA: Diagnosis not present

## 2023-11-27 DIAGNOSIS — Z716 Tobacco abuse counseling: Secondary | ICD-10-CM | POA: Diagnosis not present

## 2023-11-27 DIAGNOSIS — N898 Other specified noninflammatory disorders of vagina: Secondary | ICD-10-CM | POA: Diagnosis present

## 2023-11-27 DIAGNOSIS — G8929 Other chronic pain: Secondary | ICD-10-CM | POA: Diagnosis not present

## 2023-11-27 DIAGNOSIS — B9689 Other specified bacterial agents as the cause of diseases classified elsewhere: Secondary | ICD-10-CM | POA: Insufficient documentation

## 2023-11-27 DIAGNOSIS — M25511 Pain in right shoulder: Secondary | ICD-10-CM | POA: Diagnosis not present

## 2023-11-27 DIAGNOSIS — N760 Acute vaginitis: Secondary | ICD-10-CM | POA: Diagnosis not present

## 2023-11-27 LAB — WET PREP, GENITAL
Clue Cells Wet Prep HPF POC: NONE SEEN
Sperm: NONE SEEN
Trich, Wet Prep: NONE SEEN
WBC, Wet Prep HPF POC: <10 — AB (ref <10)
Yeast Wet Prep HPF POC: NONE SEEN

## 2023-11-27 LAB — CHLAMYDIA/NGC RT PCR (ARMC ONLY)
Chlamydia Tr: NOT DETECTED
N gonorrhoeae: NOT DETECTED

## 2023-11-27 MED ORDER — NICOTINE 7 MG/24HR TD PT24: 7.0000 mg | MEDICATED_PATCH | Freq: Every day | TRANSDERMAL | 0 refills | Status: AC

## 2023-11-27 MED ORDER — AMLODIPINE BESYLATE 10 MG PO TABS: 10.0000 mg | ORAL_TABLET | Freq: Every day | ORAL | 3 refills | Status: DC

## 2023-11-27 MED ORDER — IBUPROFEN 600 MG PO TABS
600.0000 mg | ORAL_TABLET | Freq: Once | ORAL | Status: AC
  Administered 2023-11-27: 600 mg via ORAL
  Filled 2023-11-27: qty 1

## 2023-11-27 MED ORDER — AMLODIPINE BESYLATE 10 MG PO TABS: 10.0000 mg | ORAL_TABLET | Freq: Every day | ORAL | 3 refills | Status: AC

## 2023-11-27 MED ORDER — ACETAMINOPHEN 500 MG PO TABS
1000.0000 mg | ORAL_TABLET | Freq: Once | ORAL | Status: AC
  Administered 2023-11-27: 1000 mg via ORAL
  Filled 2023-11-27: qty 2

## 2023-11-27 MED ORDER — NICOTINE POLACRILEX 4 MG MT LOZG: 4.0000 mg | LOZENGE | OROMUCOSAL | 0 refills | Status: AC | PRN

## 2023-11-27 MED ORDER — CLOTRIMAZOLE 3 2 % VA CREA: 1.0000 | TOPICAL_CREAM | Freq: Every day | VAGINAL | 0 refills | Status: AC

## 2023-11-27 NOTE — Discharge Instructions (Addendum)
 Use clotrimazole  vaginal cream as prescribed.  I also refilled your blood pressure medication amlodipine  for you to pick up the pharmacy.  Check MyChart for the results of your gonorrhea/chlamydia test.  I gave you the contact information for gynecology clinic at [facility] for you to give a call to establish care as you requested.  I also made a referral to our gastroenterologist who can set you up for a colonoscopy as you requested.  In terms of your shoulder pain I do think it is rotator cuff related. Take acetaminophen  650 mg and ibuprofen  400 mg every 6 hours for pain.  Take with food.  I have attached some information about rotator cuff injuries for you to read about, and you should call Dr. Earnestine Blanch for follow-up appointment.  I also made a referral to a primary doctor who will call you to schedule an appointment to establish care.   Thank you for choosing us  for your health care today!  Please see your primary doctor this week for a follow up appointment.   If you have any new, worsening, or unexpected symptoms call your doctor right away or come back to the emergency department for reevaluation.  It was my pleasure to care for you today.   Ginnie EDISON Cyrena, MD

## 2023-11-27 NOTE — ED Provider Notes (Addendum)
 [facility] Provider Note    Event Date/Time   First MD Initiated Contact with Patient 11/27/23 [phone]     (approximate)   History   Vaginitis and Shoulder Pain   HPI  Amy Waller is a 63 y.o. female   Past medical history of Hypertension who presents to the emergency department with vaginal irritation and thick white discharge.  She is concerned she has a yeast infection.  She also has chronic unchanged right shoulder pain.  She has a job where she is repetitive overhead movements.  There are no new injuries or direct trauma.  She has no swelling or warmth.  She has no fever.  She is also requesting a GI referral for colonoscopy because she is long overdue.  She is requesting a new gynecologist for routine care.      Physical Exam   Triage Vital Signs: ED Triage Vitals  Encounter Vitals Group     BP 11/27/23 0505 (!) 150/106     Systolic BP Percentile --      Diastolic BP Percentile --      Pulse Rate 11/27/23 0505 88     Resp 11/27/23 0505 18     Temp 11/27/23 0505 98 F (36.7 C)     Temp Source 11/27/23 0505 Oral     SpO2 11/27/23 0505 100 %     Weight 11/27/23 0506 110 lb (49.9 kg)     Height 11/27/23 0506 5' (1.524 m)     Head Circumference --      Peak Flow --      Pain Score 11/27/23 0505 8     Pain Loc --      Pain Education --      Exclude from Growth Chart --     Most recent vital signs: Vitals:   11/27/23 0505  BP: (!) 150/106  Pulse: 88  Resp: 18  Temp: 98 F (36.7 C)  SpO2: 100%    General: Awake, no distress.  CV:  Good peripheral perfusion.  Resp:  Normal effort.  Abd:  No distention.  Other:  Pleasant woman in no acute distress.  Hypertensive otherwise vital signs are normal.  Pelvic exam shows some scant white discharge, and she has a soft benign nontender abdominal exam without rigidity or guarding.  Her right shoulder has no tenderness to palpation of the bony prominences and she is able to range though  when she asked ranges it above 90 degrees begins to experience pain.  She is neurovascular intact.   ED Results / Procedures / Treatments   Labs (all labs ordered are listed, but only abnormal results are displayed) Labs Reviewed  WET PREP, GENITAL - Abnormal; Notable for the following components:      Result Value   WBC, Wet Prep HPF POC <10 (*)    All other components within normal limits  CHLAMYDIA/NGC RT PCR ([facility] ONLY)               I ordered and reviewed the above labs they are notable for wet prep shows minimal inflammatory changes with negative trichomoniasis, yeast, clue cells   RADIOLOGY I independently reviewed and interpreted shoulder x-ray and see no obvious fracture or dislocation I also reviewed radiologist's formal read.   PROCEDURES:  Critical Care performed: No  Procedures   MEDICATIONS ORDERED IN ED: Medications  acetaminophen  (TYLENOL ) tablet 1,000 mg (1,000 mg Oral Given 11/27/23 0550)  ibuprofen  (ADVIL ) tablet 600 mg (600 mg Oral  Given 11/27/23 0550)     IMPRESSION / MDM / ASSESSMENT AND PLAN / ED COURSE  I reviewed the triage vital signs and the nursing notes.                                Patient's presentation is most consistent with acute presentation with potential threat to life or bodily function.  Differential diagnosis includes, but is not limited to, chronic shoulder pain most likely rotator cuff tendinitis rather than fracture dislocation or septic joint, vulvovaginitis due to yeast infection, STI, bacterial vaginosis, atrophic vaginitis    MDM:     In terms of her vaginal irritation and white discharge I am treating her with antifungal for yeast infection.  May also be an element of atrophic vaginitis given her age and exam findings suggestive of the same.  I will give her contact information for gynecology for further evaluation after trial of antifungal medication.  She will follow-up on her MyChart and with gynecology regarding  the results of her GC and chlamydia test.  She has no urinary symptoms suggest UTI.  In terms of her chronic right shoulder pain there are no direct trauma or acute inciting event and her pain is unchanged for many years.  Repetitive movements and overhand movements especially exacerbated by overhead movements suggest short rotator cuff injury.  No signs or symptoms suggestive of dislocation or fracture and certainly does not appear to be septic joint.  I independently reviewed x-ray and showed no bony abnormalities.  Final read is pending but patient is eager to leave and I think this is appropriate given very low clinical suspicion for emergencies relating to the shoulder. I gave her anticipatory guidance, medications recommendations, and also Dr. Earnestine Patel's orthopedics follow-up information for further evaluation and treatment as needed.  Discharge.    I spent 5 minutes counseling this patient on smoking cessation.  We spoke about the patient's current tobacco use, impact of smoking, assessed willingness to quit, methods for cessation including medical management and nicotine  replacement therapy (which I prescribed to the patient) and advised follow-up with primary doctor to continue to address smoking cessation.      FINAL CLINICAL IMPRESSION(S) / ED DIAGNOSES   Final diagnoses:  Chronic right shoulder pain  Acute vaginitis  Uncontrolled hypertension  Encounter for smoking cessation counseling     Rx / DC Orders   ED Discharge Orders          Ordered    clotrimazole  (CLOTRIMAZOLE  3) 2 % vaginal cream  Daily at bedtime        11/27/23 0621    Ambulatory referral to Gastroenterology        11/27/23 0625    amLODipine  (NORVASC ) 10 MG tablet  Daily        11/27/23 0655    Ambulatory Referral to Primary Care (Establish Care)        11/27/23 0656    nicotine  (NICODERM CQ  - DOSED IN MG/24 HR) 7 mg/24hr patch  Daily        11/27/23 0658    nicotine  polacrilex (NICOTINE  MINI) 4 MG  lozenge  As needed        11/27/23 9341             Note:  This document was prepared using Dragon voice recognition software and may include unintentional dictation errors.    Cyrena Mylar, MD 11/27/23 9365    Cyrena,  Ginnie, MD 11/27/23 9341    Cyrena Ginnie, MD 11/27/23 [phone]

## 2023-11-27 NOTE — ED Triage Notes (Signed)
 Patient presents to ED POV stated she is having vaginal pain and odor for a week, also complaining of right should pain "for a long time"

## 2024-07-24 ENCOUNTER — Ambulatory Visit: Payer: MEDICAID | Admitting: Certified Nurse Midwife

## 2024-07-25 NOTE — Progress Notes (Deleted)
    GYNECOLOGY PROGRESS NOTE  Subjective:    Patient ID: Amy Waller, female    DOB: Feb 15, 1961, 63 y.o.   MRN: [id number]  HPI  Patient is a 63 y.o. G1P0020 female who presents for follow up on cyst.   {Common ambulatory SmartLinks:19316}  Review of Systems {ros; complete:30496}   Objective:   Last menstrual period 10/18/2015. There is no height or weight on file to calculate BMI. General appearance: {general exam:16600} Abdomen: {abdominal exam:16834} Pelvic: {pelvic exam:16852::cervix normal in appearance,external genitalia normal,no adnexal masses or tenderness,no cervical motion tenderness,rectovaginal septum normal,uterus normal size, shape, and consistency,vagina normal without discharge} Extremities: {extremity exam:5109} Neurologic: {neuro exam:17854}   Assessment:   No diagnosis found.   Plan:   There are no diagnoses linked to this encounter.    Camelia Fetters, CMA [facility]

## 2024-07-26 ENCOUNTER — Other Ambulatory Visit: Payer: Self-pay

## 2024-07-26 ENCOUNTER — Ambulatory Visit: Payer: MEDICAID | Admitting: Certified Nurse Midwife

## 2024-07-26 DIAGNOSIS — R102 Pelvic and perineal pain unspecified side: Secondary | ICD-10-CM

## 2024-08-16 ENCOUNTER — Other Ambulatory Visit: Payer: MEDICAID

## 2024-08-16 ENCOUNTER — Telehealth: Payer: Self-pay | Admitting: Certified Nurse Midwife

## 2024-08-16 NOTE — Telephone Encounter (Signed)
 Reached out to pt about pelvic US  that was scheduled on 10/29/205  at 11:15 per E. Slaughterbeck.  Left message for pt to call back.

## 2024-08-16 NOTE — Telephone Encounter (Signed)
 Pt has rescheduled the US  for 08/28/2024 at 11:15.

## 2024-08-28 ENCOUNTER — Other Ambulatory Visit: Payer: MEDICAID

## 2024-08-30 NOTE — Progress Notes (Signed)
    GYNECOLOGY PROGRESS NOTE  Subjective:    Patient ID: Amy Waller, female    DOB: December 16, 1960, 63 y.o.   MRN: [id number]  HPI  Patient is a 63 y.o. G80P0020 female who presents for evaluation of fibroids and follow up from ultrasound.   The following portions of the patient's history were reviewed and updated as appropriate: allergies, current medications, past family history, past medical history, past social history, past surgical history, and problem list.  Review of Systems Pertinent items are noted in HPI.   Objective:   Blood pressure (!) 136/92, pulse (!) 54, resp. rate 16, height 5' (1.524 m), weight 98 lb 12.8 oz (44.8 kg), last menstrual period 10/18/2015. Body mass index is 19.3 kg/m. General appearance: alert Physical exam deferred   Assessment:   1. History of uterine fibroid      Plan:   Reviewed that today's US  did not show any fibroids. Follow up for any more GYN issues. Due for annual with Pap  Damien Parsley, CNM [facility]

## 2024-08-31 ENCOUNTER — Ambulatory Visit (INDEPENDENT_AMBULATORY_CARE_PROVIDER_SITE_OTHER): Payer: MEDICAID

## 2024-08-31 ENCOUNTER — Ambulatory Visit: Payer: MEDICAID | Admitting: Certified Nurse Midwife

## 2024-08-31 VITALS — BP 136/92 | HR 54 | Resp 16 | Ht 60.0 in | Wt 98.8 lb

## 2024-08-31 DIAGNOSIS — Z09 Encounter for follow-up examination after completed treatment for conditions other than malignant neoplasm: Secondary | ICD-10-CM

## 2024-08-31 DIAGNOSIS — R102 Pelvic and perineal pain unspecified side: Secondary | ICD-10-CM

## 2024-08-31 DIAGNOSIS — Z86018 Personal history of other benign neoplasm: Secondary | ICD-10-CM | POA: Diagnosis not present

## 2024-08-31 DIAGNOSIS — D259 Leiomyoma of uterus, unspecified: Secondary | ICD-10-CM | POA: Diagnosis not present
